# Patient Record
Sex: Female | Born: 2000 | Hispanic: No | Marital: Single | Smoking: Never smoker
Health system: Southern US, Community
[De-identification: ages and names within clinical notes are randomized; demographics above are authoritative.]

## PROBLEM LIST (undated history)

## (undated) DIAGNOSIS — F32A Depression, unspecified: Secondary | ICD-10-CM

## (undated) DIAGNOSIS — F419 Anxiety disorder, unspecified: Secondary | ICD-10-CM

## (undated) HISTORY — PX: NO PAST SURGERIES: SHX2092

## (undated) HISTORY — DX: Depression, unspecified: F32.A

## (undated) HISTORY — DX: Anxiety disorder, unspecified: F41.9

---

## 2019-10-02 DIAGNOSIS — N926 Irregular menstruation, unspecified: Secondary | ICD-10-CM | POA: Insufficient documentation

## 2019-10-02 DIAGNOSIS — L709 Acne, unspecified: Secondary | ICD-10-CM | POA: Insufficient documentation

## 2020-07-09 ENCOUNTER — Other Ambulatory Visit: Payer: Self-pay

## 2020-07-09 ENCOUNTER — Encounter: Payer: Self-pay | Admitting: Medical-Surgical

## 2020-07-09 ENCOUNTER — Ambulatory Visit (INDEPENDENT_AMBULATORY_CARE_PROVIDER_SITE_OTHER): Payer: 59 | Admitting: Medical-Surgical

## 2020-07-09 VITALS — BP 101/70 | HR 62 | Temp 98.3°F | Ht 62.5 in | Wt 173.6 lb

## 2020-07-09 DIAGNOSIS — F329 Major depressive disorder, single episode, unspecified: Secondary | ICD-10-CM | POA: Diagnosis not present

## 2020-07-09 DIAGNOSIS — F324 Major depressive disorder, single episode, in partial remission: Secondary | ICD-10-CM | POA: Insufficient documentation

## 2020-07-09 DIAGNOSIS — Z23 Encounter for immunization: Secondary | ICD-10-CM

## 2020-07-09 DIAGNOSIS — R202 Paresthesia of skin: Secondary | ICD-10-CM

## 2020-07-09 DIAGNOSIS — F32A Depression, unspecified: Secondary | ICD-10-CM

## 2020-07-09 DIAGNOSIS — Z7689 Persons encountering health services in other specified circumstances: Secondary | ICD-10-CM

## 2020-07-09 DIAGNOSIS — F419 Anxiety disorder, unspecified: Secondary | ICD-10-CM | POA: Diagnosis not present

## 2020-07-09 DIAGNOSIS — R200 Anesthesia of skin: Secondary | ICD-10-CM | POA: Diagnosis not present

## 2020-07-09 MED ORDER — FLUOXETINE HCL 20 MG PO TABS: ORAL_TABLET | ORAL | 1 refills | Status: DC

## 2020-07-09 NOTE — Progress Notes (Signed)
New Patient Office Visit  Subjective:  Patient ID: Heidi Calhoun, female    DOB: 2001-05-23  Age: 19 y.o. MRN: [id number]  CC:  Chief Complaint  Patient presents with  . Establish Care    HPI Heidi Calhoun presents to establish care.  Works at [employer] and lives at home with her family and they are husky, Wallace.  Depression/Anxiety-reports initially experiencing depression at 19 years old when she lived in [place].  Her family relocated to [place] and she noted an improvement in her symptoms.  Unfortunately her depression and anxiety returned at about 19 years old.  She admits to self cutting in her teenage years but no longer practices this.  She continues to have difficulty with depression anxiety and believes depression to be the worst between the 2.  She has anxiety when in public places and often has panic attacks involving shortness of breath, racing heart, agitation, and shakiness.  She manages her anxiety attacks with deep breathing, self soothing activities, and or leaves the location.  She has never been on medications for depression or anxiety but she has done therapy in the past.  She felt this was helpful but does not have a current therapy relationship.  Things that make her sad involve thinking about her biological dad, relationships with men, and some childhood trauma from her relationship with her mother.  Admits to having thoughts of self-harm on most days but does not have an active plan or any weapons at home.  Denies HI.  Insomnia-reports that she has trouble sleeping some nights and is often up until 2-3 AM.  At other times, she sleeps too much.  Has quite a bit on her mind and has difficulty stopping her thoughts so that she can fall asleep.  Does not take any medications to help her sleep at night.   Past Medical History:  Diagnosis Date  . Anxiety   . Depression     Past Surgical History:  Procedure Laterality Date  . NO PAST SURGERIES       Family History  Problem Relation Age of Onset  . Hypertension Maternal Uncle   . Diabetes Maternal Uncle   . Diabetes Maternal Grandmother   . Stroke Maternal Grandmother   . Hypertension Maternal Grandfather   . Kidney disease Paternal Grandfather     Social History   Socioeconomic History  . Marital status: Single    Spouse name: Not on file  . Number of children: Not on file  . Years of education: Not on file  . Highest education level: Not on file  Occupational History    Employer: [employer]  Tobacco Use  . Smoking status: Never Smoker  . Smokeless tobacco: Never Used  Vaping Use  . Vaping Use: Former  Substance and Sexual Activity  . Alcohol use: Never  . Drug use: Never  . Sexual activity: Never  Other Topics Concern  . Not on file  Social History Narrative  . Not on file   Social Determinants of Health   Financial Resource Strain:   . Difficulty of Paying Living Expenses: Not on file  Food Insecurity:   . Worried About Programme researcher, broadcasting/film/video in the Last Year: Not on file  . Ran Out of Food in the Last Year: Not on file  Transportation Needs:   . Lack of Transportation (Medical): Not on file  . Lack of Transportation (Non-Medical): Not on file  Physical Activity:   . Days of Exercise  per Week: Not on file  . Minutes of Exercise per Session: Not on file  Stress:   . Feeling of Stress : Not on file  Social Connections:   . Frequency of Communication with Friends and Family: Not on file  . Frequency of Social Gatherings with Friends and Family: Not on file  . Attends Religious Services: Not on file  . Active Member of Clubs or Organizations: Not on file  . Attends Banker Meetings: Not on file  . Marital Status: Not on file  Intimate Partner Violence:   . Fear of Current or Ex-Partner: Not on file  . Emotionally Abused: Not on file  . Physically Abused: Not on file  . Sexually Abused: Not on file    ROS Review of Systems   Constitutional: Positive for fatigue. Negative for chills, fever and unexpected weight change.  Respiratory: Positive for chest tightness (With panic attack) and shortness of breath (With panic attack). Negative for cough and wheezing.   Cardiovascular: Positive for palpitations (With panic attack). Negative for chest pain and leg swelling.  Neurological: Positive for numbness (Right hand on awakening). Negative for dizziness, light-headedness and headaches.  Hematological: Bruises/bleeds easily.  Psychiatric/Behavioral: Positive for dysphoric mood, self-injury (History of cutting but none recently, thoughts of self-harm most days but no plan) and sleep disturbance. Negative for suicidal ideas. The patient is nervous/anxious.     Objective:   Today's Vitals: BP 101/70   Pulse 62   Temp 98.3 F (36.8 C) (Oral)   Ht 5' 2.5" (1.588 m)   Wt 173 lb 9.6 oz (78.7 kg)   SpO2 99%   BMI 31.25 kg/m   Physical Exam Vitals and nursing note reviewed.  Constitutional:      General: She is not in acute distress.    Appearance: Normal appearance.  HENT:     Head: Normocephalic and atraumatic.  Cardiovascular:     Rate and Rhythm: Normal rate and regular rhythm.     Pulses: Normal pulses.     Heart sounds: Normal heart sounds. No murmur heard.  No friction rub. No gallop.   Pulmonary:     Effort: Pulmonary effort is normal. No respiratory distress.     Breath sounds: Normal breath sounds. No wheezing.  Skin:    General: Skin is warm and dry.  Neurological:     Mental Status: She is alert and oriented to person, place, and time.  Psychiatric:        Mood and Affect: Mood normal.        Behavior: Behavior normal.        Thought Content: Thought content normal.        Judgment: Judgment normal.     Assessment & Plan:   1. Encounter to establish care Reviewed available information and discussed health care concerns.  She is due for her first Pap smear as well as an updated physical with  blood work.  2. Anxiety/depression Starting fluoxetine 10 mg daily x8 days then increase to 20 mg daily.  Discussed expectations for efficacy of medication and potential side effects when for starting.  Referring to behavioral health for establishment of a counseling relationship.  Psych emergency information provided with AVS. - Ambulatory referral to Behavioral Health  3. Numbness and tingling in right hand Suspect she has a bit of carpal tunnel from repetitive movements with her job.  Recommend wearing a brace for sleeping at night since awakening is the only time the tingling bothers her.  If her symptoms do worsen, we can evaluate further.  4. Need for Tdap vaccination Tdap given in office by MA. - Tdap vaccine greater than or equal to 7yo IM   Outpatient Encounter Medications as of 07/09/2020  Medication Sig  . doxycycline (VIBRAMYCIN) 100 MG capsule Take 100 mg by mouth 2 (two) times daily.  Marland Kitchen FLUoxetine (PROZAC) 20 MG tablet Take 1/2 tablet (10mg ) daily for 8 days then increase to 1 tablet (20mg ) daily.   No facility-administered encounter medications on file as of 07/09/2020.    Follow-up: Return in about 4 weeks (around 08/06/2020) for mood follow up.   09/08/2020, DNP, APRN, FNP-BC [facility]

## 2020-07-09 NOTE — Patient Instructions (Addendum)
For immediate mental health services:   [facility], [address], [place], [place] [address], [phone]  [facility], [address], [place], [place] [address], [phone] Any emergency room  National Suicide Prevention Lifeline, (816)559-0783    https://www.cdc.gov/vaccines/hcp/vis/vis-statements/tdap.pdf">  Tdap (Tetanus, Diphtheria, Pertussis) Vaccine: What You Need to Know 1. Why get vaccinated? Tdap vaccine can prevent tetanus, diphtheria, and pertussis. Diphtheria and pertussis spread from person to person. Tetanus enters the body through cuts or wounds.  TETANUS (T) causes painful stiffening of the muscles. Tetanus can lead to serious health problems, including being unable to open the mouth, having trouble swallowing and breathing, or death.  DIPHTHERIA (D) can lead to difficulty breathing, heart failure, paralysis, or death.  PERTUSSIS (aP), also known as "whooping cough," can cause uncontrollable, violent coughing which makes it hard to breathe, eat, or drink. Pertussis can be extremely serious in babies and young children, causing pneumonia, convulsions, brain damage, or death. In teens and adults, it can cause weight loss, loss of bladder control, passing out, and rib fractures from severe coughing. 2. Tdap vaccine Tdap is only for children 7 years and older, adolescents, and adults.  Adolescents should receive a single dose of Tdap, preferably at age 50 or 12 years. Pregnant women should get a dose of Tdap during every pregnancy, to protect the newborn from pertussis. Infants are most at risk for severe, life-threatening complications from pertussis. Adults who have never received Tdap should get a dose of Tdap. Also, adults should receive a booster dose every 10 years, or earlier in the case of a severe and dirty wound or burn. Booster doses can be either Tdap or Td (a different vaccine that protects against tetanus and  diphtheria but not pertussis). Tdap may be given at the same time as other vaccines. 3. Talk with your health care provider Tell your vaccine provider if the person getting the vaccine:  Has had an allergic reaction after a previous dose of any vaccine that protects against tetanus, diphtheria, or pertussis, or has any severe, life-threatening allergies.  Has had a coma, decreased level of consciousness, or prolonged seizures within 7 days after a previous dose of any pertussis vaccine (DTP, DTaP, or Tdap).  Has seizures or another nervous system problem.  Has ever had Guillain-Barr Syndrome (also called GBS).  Has had severe pain or swelling after a previous dose of any vaccine that protects against tetanus or diphtheria. In some cases, your health care provider may decide to postpone Tdap vaccination to a future visit.  People with minor illnesses, such as a cold, may be vaccinated. People who are moderately or severely ill should usually wait until they recover before getting Tdap vaccine.  Your health care provider can give you more information. 4. Risks of a vaccine reaction  Pain, redness, or swelling where the shot was given, mild fever, headache, feeling tired, and nausea, vomiting, diarrhea, or stomachache sometimes happen after Tdap vaccine. People sometimes faint after medical procedures, including vaccination. Tell your provider if you feel dizzy or have vision changes or ringing in the ears.  As with any medicine, there is a very remote chance of a vaccine causing a severe allergic reaction, other serious injury, or death. 5. What if there is a serious problem? An allergic reaction could occur after the vaccinated person leaves the clinic. If you see signs of a severe allergic reaction (hives, swelling of the face and throat, difficulty breathing, a fast heartbeat, dizziness, or  weakness), call 9-1-1 and get the person to the nearest hospital. For other signs that concern you,  call your health care provider.  Adverse reactions should be reported to the Vaccine Adverse Event Reporting System (VAERS). Your health care provider will usually file this report, or you can do it yourself. Visit the VAERS website at www.vaers.LAgents.no or call 325-631-2406. VAERS is only for reporting reactions, and VAERS staff do not give medical advice. 6. The National Vaccine Injury Compensation Program The [employer] Vaccine Injury Compensation Program (VICP) is a federal program that was created to compensate people who may have been injured by certain vaccines. Visit the VICP website at SpiritualWord.at or call (704)867-5488 to learn about the program and about filing a claim. There is a time limit to file a claim for compensation. 7. How can I learn more?  Ask your health care provider.  Call your local or state health department.  Contact the Centers for Disease Control and Prevention (CDC): ? Call 602-096-6078 (1-800-CDC-INFO) or ? Visit CDC's website at PicCapture.uy Vaccine Information Statement Tdap (Tetanus, Diphtheria, Pertussis) Vaccine (02/06/2019) This information is not intended to replace advice given to you by your health care provider. Make sure you discuss any questions you have with your health care provider. Document Revised: 02/15/2019 Document Reviewed: 02/18/2019 Elsevier Patient Education  2020 [employer].

## 2020-07-28 ENCOUNTER — Ambulatory Visit (INDEPENDENT_AMBULATORY_CARE_PROVIDER_SITE_OTHER): Payer: 59 | Admitting: Professional

## 2020-07-28 DIAGNOSIS — F331 Major depressive disorder, recurrent, moderate: Secondary | ICD-10-CM | POA: Diagnosis not present

## 2020-07-28 DIAGNOSIS — F4322 Adjustment disorder with anxiety: Secondary | ICD-10-CM

## 2020-08-03 ENCOUNTER — Ambulatory Visit: Payer: 59 | Admitting: Professional

## 2020-08-06 ENCOUNTER — Encounter: Payer: Self-pay | Admitting: Medical-Surgical

## 2020-08-06 ENCOUNTER — Ambulatory Visit (INDEPENDENT_AMBULATORY_CARE_PROVIDER_SITE_OTHER): Payer: 59 | Admitting: Medical-Surgical

## 2020-08-06 ENCOUNTER — Other Ambulatory Visit: Payer: Self-pay

## 2020-08-06 VITALS — BP 102/69 | HR 71 | Temp 98.1°F | Ht 62.5 in | Wt 173.5 lb

## 2020-08-06 DIAGNOSIS — F419 Anxiety disorder, unspecified: Secondary | ICD-10-CM

## 2020-08-06 DIAGNOSIS — F329 Major depressive disorder, single episode, unspecified: Secondary | ICD-10-CM

## 2020-08-06 DIAGNOSIS — F32A Depression, unspecified: Secondary | ICD-10-CM

## 2020-08-06 NOTE — Progress Notes (Signed)
Subjective:    CC: anxiety/depression follow up  HPI: Heidi Calhoun 19 year old female presenting today for anxiety/depression follow up. Started fluoxetine approximately 3 weeks ago with one week at 10mg  followed by an increase to 20mg  daily. Taking the medication as prescribed without side effects. Tolerating well but admits some difficulty taking the medication at the same time every day. Feels that her depression symptoms have improved but her anxiety seems to be worse. Most of her anxiety revolves around work due to other employees not carrying their own workload and leaving it all for her to do at closing. She endorses sleeping better at night. She has been able to see a counselor and will be continuing with therapy as she felt it was beneficial. She is also exercising regularly. She admits she does not get out much and prefers to stay home rather than go out with friends. When she does go out, she prefers to go with one person instead of a group. Feels that her mom is her best friend. At last visit, had passive thoughts of suicide but no self-harm or plan. Today, denies SI/HI.   I reviewed the past medical history, family history, social history, surgical history, and allergies today and no changes were needed.  Please see the problem list section below in epic for further details.  Past Medical History: Past Medical History:  Diagnosis Date  . Anxiety   . Depression    Past Surgical History: Past Surgical History:  Procedure Laterality Date  . NO PAST SURGERIES     Social History: Social History   Socioeconomic History  . Marital status: Single    Spouse name: Not on file  . Number of children: Not on file  . Years of education: Not on file  . Highest education level: Not on file  Occupational History    Employer: [employer]  Tobacco Use  . Smoking status: Never Smoker  . Smokeless tobacco: Never Used  Vaping Use  . Vaping Use: Former  Substance and Sexual Activity  . Alcohol  use: Never  . Drug use: Never  . Sexual activity: Never  Other Topics Concern  . Not on file  Social History Narrative  . Not on file   Social Determinants of Health   Financial Resource Strain:   . Difficulty of Paying Living Expenses: Not on file  Food Insecurity:   . Worried About in the Last Year: Not on file  . Ran Out of Food in the Last Year: Not on file  Transportation Needs:   . Lack of Transportation (Medical): Not on file  . Lack of Transportation (Non-Medical): Not on file  Physical Activity:   . Days of Exercise per Week: Not on file  . Minutes of Exercise per Session: Not on file  Stress:   . Feeling of Stress : Not on file  Social Connections:   . Frequency of Communication with Friends and Family: Not on file  . Frequency of Social Gatherings with Friends and Family: Not on file  . Attends Religious Services: Not on file  . Active Member of Clubs or Organizations: Not on file  . Attends Meetings: Not on file  . Marital Status: Not on file   Family History: Family History  Problem Relation Age of Onset  . Hypertension Maternal Uncle   . Diabetes Maternal Uncle   . Diabetes Maternal Grandmother   . Stroke Maternal Grandmother   . Hypertension Maternal Grandfather   .  Kidney disease Paternal Grandfather    Allergies: Allergies  Allergen Reactions  . Amoxicillin Hives   Medications: See med rec.  Review of Systems: See HPI for pertinent positives and negatives.   Depression screen [facility] 2/9 08/06/2020 07/09/2020  Decreased Interest 2 3  Down, Depressed, Hopeless 2 3  PHQ - 2 Score 4 6  Altered sleeping 0 3  Tired, decreased energy 2 2  Change in appetite 2 2  Feeling bad or failure about yourself  2 3  Trouble concentrating 0 3  Moving slowly or fidgety/restless 0 2  Suicidal thoughts 0 2  PHQ-9 Score 10 23  Difficult doing work/chores Somewhat difficult Somewhat difficult   GAD 7 : Generalized Anxiety  Score 08/06/2020 07/09/2020  Nervous, Anxious, on Edge 2 2  Control/stop worrying 3 1  Worry too much - different things 3 1  Trouble relaxing 3 2  Restless 3 1  Easily annoyed or irritable 2 3  Afraid - awful might happen 2 2  Total GAD 7 Score 18 12  Anxiety Difficulty Somewhat difficult Somewhat difficult   Objective:    General: Well Developed, well nourished, and in no acute distress.  Neuro: Alert and oriented x3.  HEENT: Normocephalic, atraumatic.  Skin: Warm and dry. Cardiac: Regular rate and rhythm, no murmurs rubs or gallops, no lower extremity edema.  Respiratory: Clear to auscultation bilaterally. Not using accessory muscles, speaking in full sentences.  Impression and Recommendations:    1. Anxiety/depression Good improvement in depression symptoms and sleeping much better. Worsening of anxiety possibly related to the medication but suspect that it seems worse because her depression is so much better. Options include switching to a different SSRI, continuing the medication for another few weeks as she has only been on it for 3.5 weeks, or adding in a BID Buspar. Patient would like to continue the medication at the current dose for now. I will reach out to her via MyChart in 3 weeks to see how she is doing. Depending on her response, further plans will be made. Discussed getting out and doing things that she enjoys as well as recommended exercise.   Return in about 3 months (around 11/05/2020) for mood follow up. ___________________________________________ Thayer Ohm, DNP, APRN, FNP-BC Primary Care and Sports Medicine [facility]

## 2020-08-12 ENCOUNTER — Encounter: Payer: Self-pay | Admitting: Medical-Surgical

## 2020-08-12 MED ORDER — BUSPIRONE HCL 7.5 MG PO TABS: 7.5000 mg | ORAL_TABLET | Freq: Two times a day (BID) | ORAL | 1 refills | Status: DC | PRN

## 2020-08-27 ENCOUNTER — Encounter: Payer: Self-pay | Admitting: Medical-Surgical

## 2020-09-11 ENCOUNTER — Other Ambulatory Visit: Payer: Self-pay | Admitting: Medical-Surgical

## 2020-09-15 ENCOUNTER — Encounter: Payer: Self-pay | Admitting: Medical-Surgical

## 2020-10-12 ENCOUNTER — Other Ambulatory Visit: Payer: Self-pay | Admitting: Medical-Surgical

## 2020-10-15 ENCOUNTER — Other Ambulatory Visit: Payer: Self-pay | Admitting: Medical-Surgical

## 2020-10-27 ENCOUNTER — Encounter: Payer: Self-pay | Admitting: Medical-Surgical

## 2020-10-28 MED ORDER — FLUOXETINE HCL 20 MG PO TABS
20.0000 mg | ORAL_TABLET | Freq: Every day | ORAL | 1 refills | Status: DC
Start: 2020-10-28 — End: 2020-11-05

## 2020-11-05 ENCOUNTER — Ambulatory Visit (INDEPENDENT_AMBULATORY_CARE_PROVIDER_SITE_OTHER): Payer: 59 | Admitting: Medical-Surgical

## 2020-11-05 ENCOUNTER — Other Ambulatory Visit: Payer: Self-pay

## 2020-11-05 ENCOUNTER — Encounter: Payer: Self-pay | Admitting: Medical-Surgical

## 2020-11-05 VITALS — BP 92/59 | HR 52 | Temp 98.8°F | Ht 62.5 in | Wt 175.3 lb

## 2020-11-05 DIAGNOSIS — F32A Depression, unspecified: Secondary | ICD-10-CM

## 2020-11-05 DIAGNOSIS — F419 Anxiety disorder, unspecified: Secondary | ICD-10-CM | POA: Diagnosis not present

## 2020-11-05 MED ORDER — FLUOXETINE HCL 20 MG PO TABS: 30.0000 mg | ORAL_TABLET | Freq: Every day | ORAL | 1 refills | Status: DC

## 2020-11-05 MED ORDER — BUSPIRONE HCL 7.5 MG PO TABS: 7.5000 mg | ORAL_TABLET | Freq: Two times a day (BID) | ORAL | 1 refills | Status: DC | PRN

## 2020-11-05 NOTE — Progress Notes (Signed)
Subjective:    CC: mood follow up  HPI: Pleasant 19 year old presenting today for mood follow up.  Taking fluoxetine 20 mg daily, tolerating well without side effects.  Notes that the medication does help some days but there are other days when it does not feel effective.  Using BuSpar 7.5 mg twice daily as needed, feels this medication does help with her anxiety.  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  Feels that she is handling this pretty well although she does get quite frustrated at times with other employees.  She has been working odd hours which is affecting her overall sleep.  Notes that some nights/days that she does not get very much sleep and others she is oversleeping.  She has been reading self-help books and engaging in more activities.  Recently she took her dog for hiking at the falls in [place].  She is now doing counseling through her employer's mental health program.  She just started so she is not certain if this is helpful yet or not.  Denies SI/HI.  I reviewed the past medical history, family history, social history, surgical history, and allergies today and no changes were needed.  Please see the problem list section below in epic for further details.  Past Medical History: Past Medical History:  Diagnosis Date  . Anxiety   . Depression    Past Surgical History: Past Surgical History:  Procedure Laterality Date  . NO PAST SURGERIES     Social History: Social History   Socioeconomic History  . Marital status: Single    Spouse name: Not on file  . Number of children: Not on file  . Years of education: Not on file  . Highest education level: Not on file  Occupational History    Employer: [employer]  Tobacco Use  . Smoking status: Never Smoker  . Smokeless tobacco: Never Used  Vaping Use  . Vaping Use: Former  Substance and Sexual Activity  . Alcohol use: Never  . Drug use: Never  . Sexual activity: Never   Other Topics Concern  . Not on file  Social History Narrative  . Not on file   Social Determinants of Health   Financial Resource Strain: Not on file  Food Insecurity: Not on file  Transportation Needs: Not on file  Physical Activity: Not on file  Stress: Not on file  Social Connections: Not on file   Family History: Family History  Problem Relation Age of Onset  . Hypertension Maternal Uncle   . Diabetes Maternal Uncle   . Diabetes Maternal Grandmother   . Stroke Maternal Grandmother   . Hypertension Maternal Grandfather   . Kidney disease Paternal Grandfather    Allergies: Allergies  Allergen Reactions  . Amoxicillin Hives   Medications: See med rec.  Review of Systems: See HPI for pertinent positives and negatives.   Depression screen [facility] 2/9 11/05/2020 08/06/2020 07/09/2020  Decreased Interest 2 2 3   Down, Depressed, Hopeless 2 2 3   PHQ - 2 Score 4 4 6   Altered sleeping 1 0 3  Tired, decreased energy 1 2 2   Change in appetite 1 2 2   Feeling bad or failure about yourself  1 2 3   Trouble concentrating 0 0 3  Moving slowly or fidgety/restless 0 0 2  Suicidal thoughts 0 0 2  PHQ-9 Score 8 10 23   Difficult doing work/chores Somewhat difficult Somewhat difficult Somewhat difficult   GAD 7 :  Generalized Anxiety Score 11/05/2020 08/06/2020 07/09/2020  Nervous, Anxious, on Edge 1 2 2   Control/stop worrying 1 3 1   Worry too much - different things 1 3 1   Trouble relaxing 3 3 2   Restless 1 3 1   Easily annoyed or irritable 1 2 3   Afraid - awful might happen 1 2 2   Total GAD 7 Score 9 18 12   Anxiety Difficulty Somewhat difficult Somewhat difficult Somewhat difficult   Objective:    General: Well Developed, well nourished, and in no acute distress.  Neuro: Alert and oriented x3.  HEENT: Normocephalic, atraumatic.  Skin: Warm and dry. Cardiac: Regular rate and rhythm, no murmurs rubs or gallops, no lower extremity edema.  Respiratory: Clear to auscultation  bilaterally. Not using accessory muscles, speaking in full sentences.   Impression and Recommendations:    1. Anxiety/depression Increasing fluoxetine to 30 mg daily.  Continue BuSpar 7.5 mg twice daily as needed.  Continue counseling.  Advised patient to notify me immediately should her symptoms get worse or should she have SI/HI.  Patient verbalized understanding and is agreeable to the plan.  Return in about 4 weeks (around 12/03/2020) for mood follow up. ___________________________________________ , DNP, APRN, FNP-BC Primary Care and Sports Medicine [facility]

## 2020-12-03 ENCOUNTER — Ambulatory Visit: Payer: 59 | Admitting: Medical-Surgical

## 2020-12-10 ENCOUNTER — Encounter: Payer: Self-pay | Admitting: Medical-Surgical

## 2021-01-06 ENCOUNTER — Other Ambulatory Visit: Payer: Self-pay | Admitting: Medical-Surgical

## 2021-01-27 ENCOUNTER — Encounter: Payer: Self-pay | Admitting: Medical-Surgical

## 2021-01-27 ENCOUNTER — Ambulatory Visit: Payer: 59 | Admitting: Medical-Surgical

## 2021-01-27 ENCOUNTER — Other Ambulatory Visit: Payer: Self-pay

## 2021-01-27 VITALS — BP 89/57 | HR 61 | Temp 98.5°F | Ht 62.5 in | Wt 172.6 lb

## 2021-01-27 DIAGNOSIS — F32A Depression, unspecified: Secondary | ICD-10-CM

## 2021-01-27 DIAGNOSIS — F419 Anxiety disorder, unspecified: Secondary | ICD-10-CM

## 2021-01-27 DIAGNOSIS — L659 Nonscarring hair loss, unspecified: Secondary | ICD-10-CM

## 2021-01-27 MED ORDER — FLUOXETINE HCL 20 MG PO TABS: 30.0000 mg | ORAL_TABLET | Freq: Every day | ORAL | 1 refills | Status: DC

## 2021-01-27 NOTE — Progress Notes (Signed)
Subjective:    CC: depression follow up/hair loss  HPI: Pleasant 20 year old female presenting for the following:  Depression/anxiety- taking Fluoxetine 30mg  daily, tolerating well without side effects. Feels that this medication controls her symptoms well. Only uses BuSpar about 1-2 times per month when she gets significantly anxious. Has applied to college for forensic science and wants to work as a . Will be starting classes in the Summer semester. Still has her family support in place and her dog Blue who is her companion.   Hair loss- has noticed that she is losing quite a bit of hair every day, more than she usually did. Has some variation in her diet depending on her mood with some days overeating and others barely eating at all. Had COVID near the beginning of the year.   I reviewed the past medical history, family history, social history, surgical history, and allergies today and no changes were needed.  Please see the problem list section below in epic for further details.  Past Medical History: Past Medical History:  Diagnosis Date  . Anxiety   . Depression    Past Surgical History: Past Surgical History:  Procedure Laterality Date  . NO PAST SURGERIES     Social History: Social History   Socioeconomic History  . Marital status: Single    Spouse name: Not on file  . Number of children: Not on file  . Years of education: Not on file  . Highest education level: Not on file  Occupational History    Employer: [employer]  Tobacco Use  . Smoking status: Never Smoker  . Smokeless tobacco: Never Used  Vaping Use  . Vaping Use: Former  Substance and Sexual Activity  . Alcohol use: Never  . Drug use: Never  . Sexual activity: Never  Other Topics Concern  . Not on file  Social History Narrative  . Not on file   Social Determinants of Health   Financial Resource Strain: Not on file  Food Insecurity: Not on file  Transportation Needs: Not on file   Physical Activity: Not on file  Stress: Not on file  Social Connections: Not on file   Family History: Family History  Problem Relation Age of Onset  . Hypertension Maternal Uncle   . Diabetes Maternal Uncle   . Diabetes Maternal Grandmother   . Stroke Maternal Grandmother   . Hypertension Maternal Grandfather   . Kidney disease Paternal Grandfather    Allergies: Allergies  Allergen Reactions  . Amoxicillin Hives   Medications: See med rec.  Review of Systems: See HPI for pertinent positives and negatives.   Depression screen [facility] 2/9 01/27/2021 11/05/2020 08/06/2020 07/09/2020  Decreased Interest 0 2 2 3   Down, Depressed, Hopeless 1 2 2 3   PHQ - 2 Score 1 4 4 6   Altered sleeping 2 1 0 3  Tired, decreased energy 2 1 2 2   Change in appetite 3 1 2 2   Feeling bad or failure about yourself  1 1 2 3   Trouble concentrating 1 0 0 3  Moving slowly or fidgety/restless 0 0 0 2  Suicidal thoughts 0 0 0 2  PHQ-9 Score 10 8 10 23   Difficult doing work/chores Somewhat difficult Somewhat difficult Somewhat difficult Somewhat difficult   GAD 7 : Generalized Anxiety Score 01/27/2021 11/05/2020 08/06/2020 07/09/2020  Nervous, Anxious, on Edge 1 1 2 2   Control/stop worrying 0 1 3 1   Worry too much - different things 2 1 3 1   Trouble relaxing  2 3 3 2   Restless 1 1 3 1   Easily annoyed or irritable 1 1 2 3   Afraid - awful might happen 1 1 2 2   Total GAD 7 Score 8 9 18 12   Anxiety Difficulty Somewhat difficult Somewhat difficult Somewhat difficult Somewhat difficult     Objective:    General: Well Developed, well nourished, and in no acute distress.  Neuro: Alert and oriented x3.  HEENT: Normocephalic, atraumatic.  Skin: Warm and dry. Cardiac: Regular rate and rhythm, no murmurs rubs or gallops, no lower extremity edema.  Respiratory: Clear to auscultation bilaterally. Not using accessory muscles, speaking in full sentences.   Impression and Recommendations:    1. Anxiety 2.  Depression, unspecified depression type Stable. Continue Fluoxetine 30mg  daily and BuSpar 7.5mg  prn. Refills sent to pharmacy.   3. Hair loss Checking labs. 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 Consider starting a daily MVI if not already doing so. Work to optimize dietary intake/nutrition.  - CBC with Differential/Platelet - COMPLETE METABOLIC PANEL WITH GFR - TSH - Fe+TIBC+Fer  Return in about 6 weeks (around 03/10/2021) for mood follow up. ___________________________________________ , DNP, APRN, FNP-BC Primary Care and Sports Medicine [facility] [place]

## 2021-01-27 NOTE — Patient Instructions (Signed)
MyPlate from USDA  MyPlate is an outline of a general healthy diet based on the 2010 Dietary Guidelines for Americans, from the U.S. Department of Agriculture Architect). It sets guidelines for how much food you should eat from each food group based on your age, sex, and level of physical activity. What are tips for following MyPlate? To follow MyPlate recommendations:  Eat a wide variety of fruits and vegetables, grains, and protein foods.  Serve smaller portions and eat less food throughout the day.  Limit portion sizes to avoid overeating.  Enjoy your food.  Get at least 150 minutes of exercise every week. This is about 30 minutes each day, 5 or more days per week. It can be difficult to have every meal look like MyPlate. Think about MyPlate as eating guidelines for an entire day, rather than each individual meal. Fruits and vegetables  Make half of your plate fruits and vegetables.  Eat many different colors of fruits and vegetables each day.  For a 2,000 calorie daily food plan, eat: ? 2 cups of vegetables every day. ? 2 cups of fruit every day.  1 cup is equal to: ? 1 cup raw or cooked vegetables. ? 1 cup raw fruit. ? 1 medium-sized orange, apple, or banana. ? 1 cup 100% fruit or vegetable juice. ? 2 cups raw leafy greens, such as lettuce, spinach, or kale. ?  cup dried fruit. Grains  One fourth of your plate should be grains.  Make at least half of the grains you eat each day whole grains.  For a 2,000 calorie daily food plan, eat 6 oz of grains every day.  1 oz is equal to: ? 1 slice bread. ? 1 cup cereal. ?  cup cooked rice, cereal, or pasta. Protein  One fourth of your plate should be protein.  Eat a wide variety of protein foods, including meat, poultry, fish, eggs, beans, nuts, and tofu.  For a 2,000 calorie daily food plan, eat 5 oz of protein every day.  1 oz is equal to: ? 1 oz meat, poultry, or fish. ?  cup cooked beans. ? 1 egg. ?  oz nuts  or seeds. ? 1 Tbsp peanut butter. Dairy  Drink fat-free or low-fat (1%) milk.  Eat or drink dairy as a side to meals.  For a 2,000 calorie daily food plan, eat or drink 3 cups of dairy every day.  1 cup is equal to: ? 1 cup milk, yogurt, cottage cheese, or soy milk (soy beverage). ? 2 oz processed cheese. ? 1 oz natural cheese. Fats, oils, salt, and sugars  Only small amounts of oils are recommended.  Avoid foods that are high in calories and low in nutritional value (empty calories), like foods high in fat or added sugars.  Choose foods that are low in salt (sodium). Choose foods that have less than 140 milligrams (mg) of sodium per serving.  Drink water instead of sugary drinks. Drink enough water each day to keep your urine pale yellow. Where to find support  Work with your health care provider or a nutrition specialist (dietitian) to develop a customized eating plan that is right for you.  Download an app (mobile application) to help you track your daily food intake. Where to find more information  Go to https://www.[facility].org/ for more information. Summary  MyPlate is a general guideline for healthy eating from the USDA. It is based on the 2010 Dietary Guidelines for Americans.  In general, fruits and vegetables should  take up  of your plate, grains should take up  of your plate, and protein should take up  of your plate. This information is not intended to replace advice given to you by your health care provider. Make sure you discuss any questions you have with your health care provider. Document Revised: 03/28/2019 Document Reviewed: 01/23/2017 Elsevier Patient Education  2021 Elsevier Inc.  

## 2021-01-28 LAB — CBC WITH DIFFERENTIAL/PLATELET
Absolute Monocytes: 403 cells/uL (ref 200–950)
Basophils Absolute: 31 cells/uL (ref 0–200)
Basophils Relative: 0.6 %
Eosinophils Absolute: 20 cells/uL (ref 15–500)
Eosinophils Relative: 0.4 %
HCT: 38 % (ref 35.0–45.0)
Hemoglobin: 12.6 g/dL (ref 11.7–15.5)
Lymphs Abs: 1826 cells/uL (ref 850–3900)
MCH: 29.9 pg (ref 27.0–33.0)
MCHC: 33.2 g/dL (ref 32.0–36.0)
MCV: 90 fL (ref 80.0–100.0)
MPV: 10.6 fL (ref 7.5–12.5)
Monocytes Relative: 7.9 %
Neutro Abs: 2820 cells/uL (ref 1500–7800)
Neutrophils Relative %: 55.3 %
Platelets: 229 10*3/uL (ref 140–400)
RBC: 4.22 10*6/uL (ref 3.80–5.10)
RDW: 12.7 % (ref 11.0–15.0)
Total Lymphocyte: 35.8 %
WBC: 5.1 10*3/uL (ref 3.8–10.8)

## 2021-01-28 LAB — COMPLETE METABOLIC PANEL WITH GFR
AG Ratio: 1.7 (calc) (ref 1.0–2.5)
ALT: 12 U/L (ref 6–29)
AST: 15 U/L (ref 10–30)
Albumin: 4.8 g/dL (ref 3.6–5.1)
Alkaline phosphatase (APISO): 50 U/L (ref 31–125)
BUN: 11 mg/dL (ref 7–25)
CO2: 28 mmol/L (ref 20–32)
Calcium: 9.8 mg/dL (ref 8.6–10.2)
Chloride: 102 mmol/L (ref 98–110)
Creat: 0.61 mg/dL (ref 0.50–1.10)
GFR, Est African American: 149 mL/min/{1.73_m2} (ref >=60)
GFR, Est Non African American: 129 mL/min/{1.73_m2} (ref >=60)
Globulin: 2.9 g/dL (calc) (ref 1.9–3.7)
Glucose, Bld: 81 mg/dL (ref 65–99)
Potassium: 4.2 mmol/L (ref 3.5–5.3)
Sodium: 136 mmol/L (ref 135–146)
Total Bilirubin: 0.5 mg/dL (ref 0.2–1.2)
Total Protein: 7.7 g/dL (ref 6.1–8.1)

## 2021-01-28 LAB — IRON,TIBC AND FERRITIN PANEL
%SAT: 27 % (calc) (ref 16–45)
Ferritin: 15 ng/mL — ABNORMAL LOW (ref 16–154)
Iron: 94 ug/dL (ref 40–190)
TIBC: 350 mcg/dL (calc) (ref 250–450)

## 2021-01-28 LAB — TSH: TSH: 1.05 mIU/L

## 2021-02-16 ENCOUNTER — Encounter: Payer: Self-pay | Admitting: Medical-Surgical

## 2021-02-22 ENCOUNTER — Telehealth (INDEPENDENT_AMBULATORY_CARE_PROVIDER_SITE_OTHER): Payer: 59 | Admitting: Medical-Surgical

## 2021-02-22 ENCOUNTER — Encounter: Payer: Self-pay | Admitting: Medical-Surgical

## 2021-02-22 DIAGNOSIS — R634 Abnormal weight loss: Secondary | ICD-10-CM | POA: Diagnosis not present

## 2021-02-22 DIAGNOSIS — R110 Nausea: Secondary | ICD-10-CM

## 2021-02-22 MED ORDER — FAMOTIDINE 20 MG PO TABS: 20.0000 mg | ORAL_TABLET | Freq: Two times a day (BID) | ORAL | 0 refills | Status: DC | PRN

## 2021-02-22 MED ORDER — ONDANSETRON 8 MG PO TBDP: 8.0000 mg | ORAL_TABLET | Freq: Three times a day (TID) | ORAL | 3 refills | Status: DC | PRN

## 2021-02-22 NOTE — Progress Notes (Signed)
Virtual Visit via Video Note  I connected with Heidi Calhoun on 02/22/21 at  9:50 AM EDT by a video enabled telemedicine application and verified that I am speaking with the correct person using two identifiers.   I discussed the limitations of evaluation and management by telemedicine and the availability of in person appointments. The patient expressed understanding and agreed to proceed.  Patient location: home Provider locations: office  Subjective:    CC: Weight loss, nausea  HPI: Pleasant 20 year old female presenting via MyChart video visit with complaints of frequent nausea after eating as well as rapid weight loss.  Her symptoms started immediately after her family pet died.  She has lost approximately 8 pounds.  She has had no vomiting but gets nauseous after every meal no matter what she eats.  Has been focusing more on bland foods.  She has been drinking more water lately as she is very thirsty.  Her symptoms have somewhat improved over the last week or so and now she is eating approximately 3 meals per day.  Denies fever, chills, constipation/diarrhea, or other viral symptoms.   Continues to take fluoxetine as prescribed daily.  She only uses BuSpar about once a week on Sundays due to increased work stress on those days.  Feels that it does help with her overall anxiety/depression symptoms.   Past medical history, Surgical history, Family history not pertinant except as noted below, Social history, Allergies, and medications have been entered into the medical record, reviewed, and corrections made.   Review of Systems: See HPI for pertinent positives and negatives.   Objective:    General: Speaking clearly in complete sentences without any shortness of breath.  Alert and oriented x3.  Normal judgment. No apparent acute distress.  Impression and Recommendations:    1. Nausea 2. Weight loss Consider grief reaction versus reflux versus viral gastritis.  Start famotidine  20 mg twice daily.  Zofran 8 mg ODT every 8 hours as needed for nausea.  Discussed starting with frequent small meals and bland foods and increasing as tolerated.  Continue fluoxetine and buspirone as prescribed.  Okay to increase the sparing use of buspirone during this stressful time.  I discussed the assessment and treatment plan with the patient. The patient was provided an opportunity to ask questions and all were answered. The patient agreed with the plan and demonstrated an understanding of the instructions.   The patient was advised to call back or seek an in-person evaluation if the symptoms worsen or if the condition fails to improve as anticipated.  20 minutes of non-face-to-face time was provided during this encounter.  Return if symptoms worsen or fail to improve.  Thayer Ohm, DNP, APRN, FNP-BC [facility] and Sports Medicine

## 2021-06-14 ENCOUNTER — Other Ambulatory Visit (HOSPITAL_COMMUNITY)
Admission: RE | Admit: 2021-06-14 | Discharge: 2021-06-14 | Disposition: A | Discharge status: Home / Self Care | Payer: 59 | Source: Ambulatory Visit | Attending: Medical-Surgical | Admitting: Medical-Surgical

## 2021-06-14 ENCOUNTER — Encounter: Payer: Self-pay | Admitting: Medical-Surgical

## 2021-06-14 ENCOUNTER — Ambulatory Visit (INDEPENDENT_AMBULATORY_CARE_PROVIDER_SITE_OTHER): Payer: 59 | Admitting: Medical-Surgical

## 2021-06-14 ENCOUNTER — Other Ambulatory Visit: Payer: Self-pay

## 2021-06-14 VITALS — BP 108/72 | HR 63 | Resp 20 | Ht 62.5 in | Wt 185.0 lb

## 2021-06-14 DIAGNOSIS — F32A Depression, unspecified: Secondary | ICD-10-CM | POA: Diagnosis not present

## 2021-06-14 DIAGNOSIS — Z124 Encounter for screening for malignant neoplasm of cervix: Secondary | ICD-10-CM | POA: Diagnosis present

## 2021-06-14 DIAGNOSIS — N926 Irregular menstruation, unspecified: Secondary | ICD-10-CM | POA: Diagnosis not present

## 2021-06-14 DIAGNOSIS — F419 Anxiety disorder, unspecified: Secondary | ICD-10-CM

## 2021-06-14 DIAGNOSIS — Z30013 Encounter for initial prescription of injectable contraceptive: Secondary | ICD-10-CM

## 2021-06-14 DIAGNOSIS — Z113 Encounter for screening for infections with a predominantly sexual mode of transmission: Secondary | ICD-10-CM | POA: Insufficient documentation

## 2021-06-14 LAB — OB RESULTS CONSOLE GC/CHLAMYDIA: Chlamydia: NEGATIVE

## 2021-06-14 MED ORDER — FLUOXETINE HCL 20 MG PO TABS: 40.0000 mg | ORAL_TABLET | Freq: Every day | ORAL | 1 refills | Status: DC

## 2021-06-14 MED ORDER — MEDROXYPROGESTERONE ACETATE 150 MG/ML IM SUSP
150.0000 mg | Freq: Once | INTRAMUSCULAR | Status: AC
  Administered 2021-06-14: 150 mg via INTRAMUSCULAR

## 2021-06-14 NOTE — Patient Instructions (Signed)

## 2021-06-14 NOTE — Progress Notes (Signed)
HPI with pertinent ROS:   CC: Birth control, menstrual issues  HPI: Pleasant 20 year old female presenting today for discussion on menstrual concerns and birth control.  She does have a history of irregular menses and notes that this is gotten worse as she has gained weight recently.  She has issues where she will spot for a couple of days and then stop.  A few days later, she will have significant heavy bleeding for several days.  She will stop for a week and then restart a week later.  She also has heavy cramps and mood changes with her periods.  She does have a bit of excessive hair just below her bellybutton but no issues with hirsutism on the face.  Has never had a Pap smear but would like to have that done today.  Has recently become sexually active and although she uses condoms most of the time, she has had 1 episode without a condom and would like to have STI testing.  She is interested in birth control for contraception as well as menstrual cycle management.  Took pills in the past and did okay with them but notes she had a lot of spotting while she was on them.  Has not been on any other birth control.  Mood-taking fluoxetine 30 mg daily, tolerating well without side effects.  She has had a lot of changes in her life recently and is feeling somewhat stressed out and down and depressed.  She does have a new job at a new location and reports that is a positive.  They did lose their beloved family pet but have recently gotten 2 new puppies who are now 44 months old.  That is helping with her grief some but she still has moments of sadness.  She has BuSpar 7.5 mg that she uses twice daily as needed.  She only takes this every now and then but does note that it helps overall.  Denies SI/HI.  I reviewed the past medical history, family history, social history, surgical history, and allergies today and no changes were needed.  Please see the problem list section below in epic for further  details.   Physical exam:   General: Well Developed, well nourished, and in no acute distress.  Neuro: Alert and oriented x3.  HEENT: Normocephalic, atraumatic.  Skin: Warm and dry. Cardiac: Regular rate and rhythm, no murmurs rubs or gallops, no lower extremity edema.  Respiratory: Clear to auscultation bilaterally. Not using accessory muscles, speaking in full sentences. Pelvic exam: normal external genitalia, vulva, vagina, cervix, uterus and adnexa, PAP: Pap smear done today, WET MOUNT done - results: Pending lab report, exam chaperoned by Gonzella Lex, MA.  Impression and Recommendations:    1. Encounter for initial prescription of injectable contraceptive 2. Irregular menses Discussed available options for management of menses and contraception.  After review of risks versus benefits, she would like to try Depo-Provera.  Urine pregnancy test negative today.  Strongly encouraged use of condoms with all sexual activity including oral sex to prevent transmission of STIs. - medroxyPROGESTERone (DEPO-PROVERA) injection 150 mg  3. Depression, unspecified depression type 4. Anxiety Increasing fluoxetine to 40 mg daily.  Encouraged her to use BuSpar 7.5 mg twice daily as needed more frequently if this is helpful.  5. Cervical cancer screening Pap smear completed today.  STI testing for gonorrhea, chlamydia, and trichomonas added to sample. - Cytology - PAP  6. Routine screening for STI (sexually transmitted infection) Sending wet prep for yeast and  BV.  STI testing added onto Pap smear sample.  Checking for HIV, hepatitis B, hepatitis C, and syphilis. - WET PREP FOR TRICH, YEAST, CLUE - Cytology - PAP - HIV Antibody (routine testing w rflx) - Hepatitis C Antibody - Hepatitis B surface antigen - RPR  Return in about 4 weeks (around 07/12/2021) for mood/birth control follow up. ___________________________________________ Thayer Ohm, DNP, APRN, FNP-BC Primary Care and  Sports Medicine [facility] [place]

## 2021-06-15 LAB — WET PREP FOR TRICH, YEAST, CLUE
MICRO NUMBER:: 12216935
Specimen Quality: ADEQUATE

## 2021-06-15 LAB — HEPATITIS C ANTIBODY
Hepatitis C Ab: NONREACTIVE
SIGNAL TO CUT-OFF: 0.01 (ref <1.00)

## 2021-06-15 LAB — CYTOLOGY - PAP
Chlamydia: NEGATIVE
Comment: NEGATIVE
Comment: NEGATIVE
Comment: NORMAL
Diagnosis: NEGATIVE
Neisseria Gonorrhea: NEGATIVE
Trichomonas: NEGATIVE

## 2021-06-15 LAB — RPR: RPR Ser Ql: NONREACTIVE

## 2021-06-15 LAB — HIV ANTIBODY (ROUTINE TESTING W REFLEX): HIV 1&2 Ab, 4th Generation: NONREACTIVE

## 2021-06-15 LAB — HEPATITIS B SURFACE ANTIGEN: Hepatitis B Surface Ag: NONREACTIVE

## 2021-07-30 ENCOUNTER — Other Ambulatory Visit: Payer: Self-pay | Admitting: Medical-Surgical

## 2021-07-30 ENCOUNTER — Ambulatory Visit: Payer: 59 | Admitting: Medical-Surgical

## 2021-08-09 ENCOUNTER — Encounter: Payer: Self-pay | Admitting: Medical-Surgical

## 2021-08-09 ENCOUNTER — Ambulatory Visit (INDEPENDENT_AMBULATORY_CARE_PROVIDER_SITE_OTHER): Payer: 59 | Admitting: Medical-Surgical

## 2021-08-09 VITALS — BP 117/80 | HR 71 | Resp 20 | Ht 62.5 in | Wt 191.1 lb

## 2021-08-09 DIAGNOSIS — Z3202 Encounter for pregnancy test, result negative: Secondary | ICD-10-CM | POA: Diagnosis not present

## 2021-08-09 DIAGNOSIS — F419 Anxiety disorder, unspecified: Secondary | ICD-10-CM | POA: Diagnosis not present

## 2021-08-09 DIAGNOSIS — Z23 Encounter for immunization: Secondary | ICD-10-CM | POA: Diagnosis not present

## 2021-08-09 DIAGNOSIS — F32A Depression, unspecified: Secondary | ICD-10-CM | POA: Diagnosis not present

## 2021-08-09 LAB — POCT URINE PREGNANCY: Preg Test, Ur: NEGATIVE

## 2021-08-09 MED ORDER — BUPROPION HCL ER (XL) 150 MG PO TB24: 150.0000 mg | ORAL_TABLET | ORAL | 0 refills | Status: DC

## 2021-08-09 MED ORDER — FLUOXETINE HCL 60 MG PO TABS: 60.0000 mg | ORAL_TABLET | Freq: Every day | ORAL | 1 refills | Status: DC

## 2021-08-09 NOTE — Progress Notes (Signed)
  HPI with pertinent ROS:   CC: mood follow up  HPI: Pleasant 20 year old female presenting today for mood follow up. Taking Fluoxetine 40mg  daily as prescribed, tolerating well without side effects. Taking Buspar 7.5mg  daily. Feels like the medications are not helping like they did several months ago. Notes her depressive symptoms are the biggest issue now and she sleeps a lot with little to no interest in doing things. Rarely interacts with people outside of work/family. Doing counseling every couple of weeks but isn't sure if it's helpful or not. Admits to using food as comfort and has had some associated weight gain. Not exercising.   I reviewed the past medical history, family history, social history, surgical history, and allergies today and no changes were needed.  Please see the problem list section below in epic for further details.   Physical exam:   General: Well Developed, well nourished, and in no acute distress.  Neuro: Alert and oriented x3.  HEENT: Normocephalic, atraumatic.  Skin: Warm and dry Cardiac: Regular rate and rhythm, no murmurs rubs or gallops, no lower extremity edema.  Respiratory: Clear to auscultation bilaterally. Not using accessory muscles, speaking in full sentences.  Impression and Recommendations:    1. Flu vaccine need Flu vaccine given in office.  - Flu Vaccine QUAD 60mo+IM (Fluarix, Fluzone & Alfiuria Quad PF)  2. Pregnancy examination or test, negative result On Depo but patient requested. Negative result.  - POCT urine pregnancy  3. Anxiety 4. Depression, unspecified depression type Increasing fluoxetine to 60mg  daily. Continue Buspar 7.5mg  BID prn. Adding Wellbutrin 150mg  daily. Continue counseling.   Return in about 4 weeks (around 09/06/2021) for mood follow up. ___________________________________________ , DNP, APRN, FNP-BC Primary Care and Sports Medicine [facility] [place]

## 2021-08-25 ENCOUNTER — Encounter: Payer: Self-pay | Admitting: Medical-Surgical

## 2021-08-25 DIAGNOSIS — R440 Auditory hallucinations: Secondary | ICD-10-CM

## 2021-08-25 DIAGNOSIS — R441 Visual hallucinations: Secondary | ICD-10-CM

## 2021-08-25 DIAGNOSIS — F32A Depression, unspecified: Secondary | ICD-10-CM

## 2021-08-25 DIAGNOSIS — F419 Anxiety disorder, unspecified: Secondary | ICD-10-CM

## 2021-09-06 ENCOUNTER — Ambulatory Visit: Payer: 59

## 2021-09-06 ENCOUNTER — Encounter: Payer: Self-pay | Admitting: Medical-Surgical

## 2021-09-06 ENCOUNTER — Ambulatory Visit: Payer: 59 | Admitting: Medical-Surgical

## 2021-09-06 VITALS — BP 111/75 | HR 67 | Resp 20 | Ht 62.2 in | Wt 191.0 lb

## 2021-09-06 DIAGNOSIS — Z3042 Encounter for surveillance of injectable contraceptive: Secondary | ICD-10-CM | POA: Diagnosis not present

## 2021-09-06 DIAGNOSIS — F419 Anxiety disorder, unspecified: Secondary | ICD-10-CM

## 2021-09-06 DIAGNOSIS — F32A Depression, unspecified: Secondary | ICD-10-CM | POA: Diagnosis not present

## 2021-09-06 MED ORDER — BUSPIRONE HCL 7.5 MG PO TABS: 7.5000 mg | ORAL_TABLET | Freq: Two times a day (BID) | ORAL | 1 refills | Status: DC | PRN

## 2021-09-06 MED ORDER — MEDROXYPROGESTERONE ACETATE 150 MG/ML IM SUSY: PREFILLED_SYRINGE | Freq: Once | INTRAMUSCULAR | Status: AC

## 2021-09-06 MED ORDER — FLUOXETINE HCL 60 MG PO TABS: 60.0000 mg | ORAL_TABLET | Freq: Every day | ORAL | 1 refills | Status: DC

## 2021-09-06 NOTE — Progress Notes (Signed)
  HPI with pertinent ROS:   CC: Mood follow-up, next Depo shot  HPI: Pleasant 20 year old female presenting today for follow up on mood. She is taking Fluoxetine 60mg  daily, tolerating well without side effects. Also taking BuSpar 7.5 mg twice daily prn anxiety. Reports she uses this pretty regularly. Most recently was experiencing visual hallucinations that were very disturbing to her. She was referred to psychiatry for further management and will have her first appointment with them in about a month. Since her message, she has not had any more hallucinations but she attributes this to her pagan friend doing a protection spell for her. Notes that her anxiety symptoms have gotten better but she is more depressed. Did not start Wellbutrin that was prescribed at our last visit. Reports not sleeping well. Denies SI/HI.   I reviewed the past medical history, family history, social history, surgical history, and allergies today and no changes were needed.  Please see the problem list section below in epic for further details.   Physical exam:   General: Well Developed, well nourished, and in no acute distress.  Neuro: Alert and oriented x3. HEENT: Normocephalic, atraumatic. Skin: Warm and dry. Cardiac: Regular rate and rhythm, no murmurs rubs or gallops, no lower extremity edema.  Respiratory: Clear to auscultation bilaterally. Not using accessory muscles, speaking in full sentences.  Impression and Recommendations:    1. Encounter for surveillance of injectable contraceptive Depo-Provera given in office today.   2. Anxiety 3. Depression, unspecified depression type Discussed symptoms with patient. Given that she is going to see psychiatry in about a month, she would like to continue her current regimen of Fluoxetine with prn BuSpar until she sees them. Refills sent to pharmacy.   Return in about 6 months (around 03/06/2022) for general follow  up. ___________________________________________ 03/08/2022, DNP, APRN, FNP-BC Primary Care and Sports Medicine [facility] [place]

## 2021-10-07 ENCOUNTER — Encounter (HOSPITAL_COMMUNITY): Payer: Self-pay | Admitting: Psychiatry

## 2021-10-07 ENCOUNTER — Ambulatory Visit (INDEPENDENT_AMBULATORY_CARE_PROVIDER_SITE_OTHER): Payer: 59 | Admitting: Psychiatry

## 2021-10-07 ENCOUNTER — Other Ambulatory Visit: Payer: Self-pay

## 2021-10-07 VITALS — BP 116/76 | HR 57 | Ht 62.5 in | Wt 190.0 lb

## 2021-10-07 DIAGNOSIS — F419 Anxiety disorder, unspecified: Secondary | ICD-10-CM

## 2021-10-07 DIAGNOSIS — F331 Major depressive disorder, recurrent, moderate: Secondary | ICD-10-CM | POA: Diagnosis not present

## 2021-10-07 MED ORDER — BUPROPION HCL ER (SR) 100 MG PO TB12: 100.0000 mg | ORAL_TABLET | Freq: Every day | ORAL | 1 refills | Status: DC

## 2021-10-07 NOTE — Progress Notes (Signed)
Psychiatric Initial Adult Assessment   Patient Identification: Heidi Calhoun MRN:  [id number] Date of Evaluation:  10/07/2021 Referral Source: primary care Chief Complaint:  establish care ,depression Visit Diagnosis:    ICD-10-CM   1. MDD (major depressive disorder), recurrent episode, moderate (HCC)  F33.1     2. Anxiety disorder, unspecified type  F41.9       History of Present Illness: Patient is a 20 years old currently single female living with her mom and stepdad.  She is currently working Scientist, research (medical) referred by primary care physician to establish care for possible depression and anxiety  Patient states has suffered from depression since young age and has a difficult childhood growing up with parents who would fight with his other.  Currently she is living with her mom and stepdad at age 30 she has self cut behavior and was started on therapy.  Lately this year she has been struggling with depression specially after COVID and also one of her dog died middle of this year leading to her exacerbation of depression including decreased energy withdrawn decreased interest in things excessive sleeping appetite changes and feeling of despair but not hopelessness or suicidal thoughts  She does endorse worries but she believes her depression is more concerning rather than anxiety her anxiety has improved with the help of Prozac she is not worrying excessively she does have a sleep irregularity and decreased sleep at times or excessive she wakes up tired she does suffer from anemia  She feels Prozac has helped depression to certain extent but still feels above symptoms.  There is no associated psychotic symptoms as of now middle of October there was 1-2 times she would hear her voice calling her name or she was seeing figures standing next to her on the right side it happened for a couple of days but not anymore she is not endorsing hallucinations as of now she does  endorse she was feeling down depressed and it may be related with Stress  but she is not experiencing that as of now There is no clear manic symptoms Denies drug use Denies past psychiatric admission or treatment  She does see a therapist with [facility] biweekly to cope with her depression and coping skills  Aggravating factors difficult childhood.  Work stress, dog died Modifying factors; her mom, 2 husky dogs younger sister       Past Psychiatric History: depression, anxiety  Previous Psychotropic Medications: No   Substance Abuse History in the last 12 months:  No.  Consequences of Substance Abuse: NA  Past Medical History:  Past Medical History:  Diagnosis Date   Anxiety    Depression     Past Surgical History:  Procedure Laterality Date   NO PAST SURGERIES      Family Psychiatric History: momL: depression; dad : alcohol use  Family History:  Family History  Problem Relation Age of Onset   Hypertension Maternal Uncle    Diabetes Maternal Uncle    Diabetes Maternal Grandmother    Stroke Maternal Grandmother    Hypertension Maternal Grandfather    Kidney disease Paternal Grandfather     Social History:   Social History   Socioeconomic History   Marital status: Single    Spouse name: Not on file   Number of children: Not on file   Years of education: Not on file   Highest education level: Not on file  Occupational History    Employer: [employer]  Tobacco Use  Smoking status: Never   Smokeless tobacco: Never  Vaping Use   Vaping Use: Former  Substance and Sexual Activity   Alcohol use: Never   Drug use: Never   Sexual activity: Yes    Birth control/protection: Injection  Other Topics Concern   Not on file  Social History Narrative   Not on file   Social Determinants of Health   Financial Resource Strain: Not on file  Food Insecurity: Not on file  Transportation Needs: Not on file  Physical Activity: Not on file  Stress: Not on file   Social Connections: Not on file    Additional Social History: grew up with parents in [place] they would argue and fight. Difficult growing up, struggled with depression as teenage and did self cutting around age 73  Allergies:   Allergies  Allergen Reactions   Amoxicillin Hives    Metabolic Disorder Labs: No results found for: HGBA1C, MPG No results found for: PROLACTIN No results found for: CHOL, TRIG, HDL, CHOLHDL, VLDL, LDLCALC Lab Results  Component Value Date   TSH 1.05 01/27/2021    Therapeutic Level Labs: No results found for: LITHIUM No results found for: CBMZ No results found for: VALPROATE  Current Medications: Current Outpatient Medications  Medication Sig Dispense Refill   buPROPion ER (WELLBUTRIN SR) 100 MG 12 hr tablet Take 1 tablet (100 mg total) by mouth daily. 30 tablet 1   FLUoxetine HCl 60 MG TABS Take 60 mg by mouth daily. 90 tablet 1   No current facility-administered medications for this visit.    Psychiatric Specialty Exam: Review of Systems  Cardiovascular:  Negative for chest pain.  Psychiatric/Behavioral:  Positive for dysphoric mood. Negative for agitation, self-injury and suicidal ideas.    Blood pressure 116/76, pulse (!) 57, height 5' 2.5" (1.588 m), weight 190 lb (86.2 kg), SpO2 97 %.Body mass index is 34.2 kg/m.  General Appearance: Casual  Eye Contact:  Fair  Speech:  Normal Rate  Volume:  Decreased  Mood:  Dysphoric  Affect:  Congruent  Thought Process:  Goal Directed  Orientation:  Full (Time, Place, and Person)  Thought Content:  Rumination  Suicidal Thoughts:  No  Homicidal Thoughts:  No  Memory:  Immediate;   Fair  Judgement:  Fair  Insight:  Fair  Psychomotor Activity:  Decreased  Concentration:  Concentration: Fair  Recall:  Good  Fund of Knowledge:Good  Language: Fair  Akathisia:  NA  Handed:    AIMS (if indicated):  not done  Assets:  Communication Skills Desire for Improvement Housing Physical Health  ADL's:   Intact  Cognition: WNL  Sleep:  Poor   Screenings: GAD-7    Flowsheet Row Office Visit from 09/06/2021 in [facility] Office Visit from 08/09/2021 in [facility] Office Visit from 01/27/2021 in [facility] At [facility] Office Visit from 11/05/2020 in [facility] At [facility] Office Visit from 08/06/2020 in [facility]  Total GAD-7 Score 11 13 8 9 18       PHQ2-9    Flowsheet Row Office Visit from 10/07/2021 in [facility] [place] Office Visit from 09/06/2021 in [facility] Office Visit from 08/09/2021 in [facility] Office Visit from 01/27/2021 in [facility] At [facility] Office Visit from 11/05/2020 in [facility] At [facility]  PHQ-2 Total Score 5 3 6 1 4   PHQ-9 Total Score 20 16 19 10 8       Flowsheet Row Office Visit from 10/07/2021 in [facility] AT [place]  C-SSRS RISK CATEGORY Error: Q3, 4, or 5 should not be populated when Q2 is No       Assessment and Plan: as follows Major depressive disorder recurrent moderate to severe; she is not endorsing psychotic symptoms as of now she is already on Prozac but she feels less energy decreased interest in things.  We will add Wellbutrin 100 mg cautioned about if there is any concern about hallucinations to notify she is also need to follow-up closely with primary care physician and treatment for her anemia continue therapy Discussed sleep hygiene Generalized anxiety disorder anxiety disorder unspecified improved with Prozac continue Prozac  Follow-up in 3 to 4 weeks or earlier if needed Face to face time spent 45 min including review and charting 14/11/2020, MD 12/1/20229:46 AM

## 2021-10-22 ENCOUNTER — Encounter: Payer: Self-pay | Admitting: Emergency Medicine

## 2021-10-22 ENCOUNTER — Other Ambulatory Visit: Payer: Self-pay

## 2021-10-22 ENCOUNTER — Emergency Department
Admission: EM | Admit: 2021-10-22 | Discharge: 2021-10-22 | Disposition: A | Discharge status: Home / Self Care | Payer: 59 | Source: Home / Self Care

## 2021-10-22 DIAGNOSIS — J039 Acute tonsillitis, unspecified: Secondary | ICD-10-CM | POA: Diagnosis not present

## 2021-10-22 DIAGNOSIS — H6692 Otitis media, unspecified, left ear: Secondary | ICD-10-CM

## 2021-10-22 MED ORDER — CEFDINIR 300 MG PO CAPS: 300.0000 mg | ORAL_CAPSULE | Freq: Two times a day (BID) | ORAL | 0 refills | Status: AC

## 2021-10-22 MED ORDER — PREDNISONE 20 MG PO TABS: ORAL_TABLET | ORAL | 0 refills | Status: DC

## 2021-10-22 NOTE — Discharge Instructions (Addendum)
Advised patient to take medication as directed with food to completion.  Advised patient to take Prednisone with first dose of Cefdinir for the next 5 of 10 days.  Encouraged patient to increase daily water intake while taking this medication.

## 2021-10-22 NOTE — ED Provider Notes (Signed)
Heidi Calhoun [facility]    CSN: [id number] Arrival date & time: 10/22/21  1716      History   Chief Complaint Chief Complaint  Patient presents with   Nasal Congestion    HPI Heidi Calhoun is a 20 y.o. female.   HPI 20 year old female presents with nasal congestion, sore throat, and eye itching that started on Monday.  Reports taking OTC cold medications.  Past Medical History:  Diagnosis Date   Anxiety    Depression     Patient Active Problem List   Diagnosis Date Noted   Anxiety 07/09/2020   Depression 07/09/2020   Numbness and tingling in right hand 07/09/2020   Irregular menses 10/02/2019   Acne 10/02/2019    Past Surgical History:  Procedure Laterality Date   NO PAST SURGERIES      OB History   No obstetric history on file.      Home Medications    Prior to Admission medications   Medication Sig Start Date End Date Taking? Authorizing Provider  cefdinir (OMNICEF) 300 MG capsule Take 1 capsule (300 mg total) by mouth 2 (two) times daily for 10 days. 10/22/21 11/01/21 Yes Trevor Iha, FNP  predniSONE (DELTASONE) 20 MG tablet Take 3 tabs PO daily x 5 days. 10/22/21  Yes Trevor Iha, FNP  buPROPion ER [facility] SR) 100 MG 12 hr tablet Take 1 tablet (100 mg total) by mouth daily. 10/07/21 10/07/22  Thresa Ross, MD  FLUoxetine HCl 60 MG TABS Take 60 mg by mouth daily. 09/06/21   Christen Butter, NP    Family History Family History  Problem Relation Age of Onset   Hypertension Maternal Uncle    Diabetes Maternal Uncle    Diabetes Maternal Grandmother    Stroke Maternal Grandmother    Hypertension Maternal Grandfather    Kidney disease Paternal Grandfather     Social History Social History   Tobacco Use   Smoking status: Never   Smokeless tobacco: Never  Vaping Use   Vaping Use: Former  Substance Use Topics   Alcohol use: Never   Drug use: Never     Allergies   Amoxicillin   Review of Systems Review of Systems  HENT:   Positive for congestion and sore throat.   Eyes:  Positive for itching.  All other systems reviewed and are negative.   Physical Exam Triage Vital Signs ED Triage Vitals  Enc Vitals Group     BP 10/22/21 1730 116/78     Pulse Rate 10/22/21 1730 81     Resp 10/22/21 1730 18     Temp 10/22/21 1730 98.2 F (36.8 C)     Temp Source 10/22/21 1730 Oral     SpO2 10/22/21 1730 99 %     Weight 10/22/21 1732 188 lb (85.3 kg)     Height --      Head Circumference --      Peak Flow --      Pain Score 10/22/21 1732 4     Pain Loc --      Pain Edu? --      Excl. in GC? --    No data found.  Updated Vital Signs BP 116/78 (BP Location: Right Arm)    Pulse 81    Temp 98.2 F (36.8 C) (Oral)    Resp 18    Wt 188 lb (85.3 kg)    LMP 10/21/2021 (Exact Date)    SpO2 99%    BMI 33.84 kg/m  Physical Exam Vitals and nursing note reviewed.  Constitutional:      General: She is not in acute distress.    Appearance: Normal appearance. She is obese. She is not ill-appearing.  HENT:     Head: Normocephalic and atraumatic.     Right Ear: Hearing, tympanic membrane, ear canal and external ear normal.     Left Ear: Hearing, ear canal and external ear normal. Tympanic membrane is erythematous and bulging.     Mouth/Throat:     Mouth: Mucous membranes are moist.     Pharynx: Oropharynx is clear. Uvula midline. Uvula swelling present.     Tonsils: Tonsillar exudate present. 4+ on the right. 4+ on the left.  Eyes:     Extraocular Movements: Extraocular movements intact.     Conjunctiva/sclera: Conjunctivae normal.     Pupils: Pupils are equal, round, and reactive to light.  Cardiovascular:     Rate and Rhythm: Normal rate and regular rhythm.     Pulses: Normal pulses.     Heart sounds: Normal heart sounds.  Pulmonary:     Effort: Pulmonary effort is normal.     Breath sounds: Normal breath sounds.  Musculoskeletal:     Cervical back: Normal range of motion and neck supple.  Skin:     General: Skin is warm and dry.  Neurological:     General: No focal deficit present.     Mental Status: She is alert and oriented to person, place, and time. Mental status is at baseline.     UC Treatments / Results  Labs (all labs ordered are listed, but only abnormal results are displayed) Labs Reviewed - No data to display  EKG   Radiology No results found.  Procedures Procedures (including critical care time)  Medications Ordered in UC Medications - No data to display  Initial Impression / Assessment and Plan / UC Course  I have reviewed the triage vital signs and the nursing notes.  Pertinent labs & imaging results that were available during my care of the patient were reviewed by me and considered in my medical decision making (see chart for details).      MDM: 1.  Acute tonsillitis, unspecified etiology-Rx'd Cefdinir and prednisone; 2.  Acute left otitis media-Rx'd Cefdinir. Advised patient to take medication as directed with food to completion.  Advised patient to take Prednisone with first dose of Cefdinir for the next 5 of 10 days.  Encouraged patient to increase daily water intake while taking this medication.Advised patient to take medication as directed with food to completion.  Advised patient to take Prednisone with first dose of Cefdinir for the next 5 of 10 days.  Encouraged patient to increase daily water intake while taking this medication.  Work note provided prior to discharge.  Patient discharged home, hemodynamically stable  Final Clinical Impressions(s) / UC Diagnoses   Final diagnoses:  Acute tonsillitis, unspecified etiology  Acute left otitis media     Discharge Instructions      Advised patient to take medication as directed with food to completion.  Advised patient to take Prednisone with first dose of Cefdinir for the next 5 of 10 days.  Encouraged patient to increase daily water intake while taking this medication.     ED Prescriptions      Medication Sig Dispense Auth. Provider   cefdinir (OMNICEF) 300 MG capsule Take 1 capsule (300 mg total) by mouth 2 (two) times daily for 10 days. 20 capsule Trevor Iha, FNP  predniSONE (DELTASONE) 20 MG tablet Take 3 tabs PO daily x 5 days. 15 tablet Trevor Iha, FNP      PDMP not reviewed this encounter.   Trevor Iha, FNP 10/22/21 [id number]

## 2021-10-22 NOTE — ED Triage Notes (Signed)
Pt c/o nasal congestion, sore throat and eye itching that started Monday. Has been taking OTC cold meds.

## 2021-11-04 ENCOUNTER — Encounter (HOSPITAL_COMMUNITY): Payer: Self-pay | Admitting: Psychiatry

## 2021-11-04 ENCOUNTER — Ambulatory Visit (INDEPENDENT_AMBULATORY_CARE_PROVIDER_SITE_OTHER): Payer: 59 | Admitting: Psychiatry

## 2021-11-04 VITALS — BP 124/78 | Temp 97.6°F | Ht 62.5 in | Wt 193.0 lb

## 2021-11-04 DIAGNOSIS — F331 Major depressive disorder, recurrent, moderate: Secondary | ICD-10-CM | POA: Diagnosis not present

## 2021-11-04 DIAGNOSIS — F419 Anxiety disorder, unspecified: Secondary | ICD-10-CM

## 2021-11-04 MED ORDER — BUPROPION HCL ER (SR) 150 MG PO TB12: 150.0000 mg | ORAL_TABLET | Freq: Every day | ORAL | 1 refills | Status: DC

## 2021-11-04 NOTE — Progress Notes (Signed)
[facility] Follow up visit  Patient Identification: Heidi Calhoun MRN:  [id number] Date of Evaluation:  11/04/2021 Referral Source: primary care Chief Complaint:  establish care ,depression Visit Diagnosis:    ICD-10-CM   1. MDD (major depressive disorder), recurrent episode, moderate (HCC)  F33.1     2. Anxiety disorder, unspecified type  F41.9       History of Present Illness: Patient is a 20 years old currently single female living with her mom and stepdad.  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  Work stress, dog died Modifying factors; her mom, 2 husky dogs , younger sister  Severity some better, still subdued     Past Psychiatric History: depression, anxiety  Previous Psychotropic Medications: No     Past Medical History:  Past Medical History:  Diagnosis Date   Anxiety    Depression     Past Surgical History:  Procedure Laterality Date   NO PAST SURGERIES      Family Psychiatric History: momL: depression; dad : alcohol use  Family History:  Family History  Problem Relation Age of Onset   Hypertension Maternal Uncle    Diabetes Maternal Uncle    Diabetes Maternal Grandmother    Stroke Maternal Grandmother    Hypertension Maternal Grandfather    Kidney disease Paternal Grandfather     Social History:    Social History   Socioeconomic History   Marital status: Single    Spouse name: Not on file   Number of children: Not on file   Years of education: Not on file   Highest education level: Not on file  Occupational History    Employer: [employer]  Tobacco Use   Smoking status: Never   Smokeless tobacco: Never  Vaping Use   Vaping Use: Former  Substance and Sexual Activity   Alcohol use: Never   Drug use: Never   Sexual activity: Yes    Birth control/protection: Injection  Other Topics Concern   Not on file  Social History Narrative   Not on file   Social Determinants of Health   Financial Resource Strain: Not on file  Food Insecurity: Not on file  Transportation Needs: Not on file  Physical Activity: Not on file  Stress: Not on file  Social Connections: Not on file    Allergies:   Allergies  Allergen Reactions   Amoxicillin Hives    Metabolic Disorder Labs: No results found for: HGBA1C, MPG No results found for: PROLACTIN No results found for: CHOL, TRIG, HDL, CHOLHDL, VLDL, LDLCALC Lab Results  Component Value Date   TSH 1.05 01/27/2021    Therapeutic Level Labs: No results found for: LITHIUM No results found for: CBMZ No results found for: VALPROATE  Current Medications: Current Outpatient Medications  Medication Sig Dispense Refill   buPROPion (WELLBUTRIN SR) 150 MG 12 hr tablet Take 1 tablet (150  mg total) by mouth daily. 30 tablet 1   FLUoxetine HCl 60 MG TABS Take 60 mg by mouth daily. 90 tablet 1   predniSONE (DELTASONE) 20 MG tablet Take 3 tabs PO daily x 5 days. 15 tablet 0   No current facility-administered medications for this visit.    Psychiatric Specialty Exam: Review of Systems  Cardiovascular:  Negative for chest pain.  Neurological:  Negative for tremors.  Psychiatric/Behavioral:  Positive for dysphoric mood. Negative for agitation, self-injury and suicidal ideas.    Blood pressure 124/78, temperature 97.6 F (36.4 C), height 5'  2.5" (1.588 m), weight 193 lb (87.5 kg), last menstrual period 10/21/2021.Body mass index is 34.74 kg/m.  General Appearance: Casual  Eye Contact:  Fair  Speech:  Normal Rate  Volume:  Decreased  Mood:  subdued  Affect:  Congruent  Thought Process:  Goal Directed  Orientation:  Full (Time, Place, and Person)  Thought Content:  Rumination  Suicidal Thoughts:  No  Homicidal Thoughts:  No  Memory:  Immediate;   Fair  Judgement:  Fair  Insight:  Fair  Psychomotor Activity:  Decreased  Concentration:  Concentration: Fair  Recall:  Good  Fund of Knowledge:Good  Language: Fair  Akathisia:  NA  Handed:    AIMS (if indicated):  not done  Assets:  Communication Skills Desire for Improvement Housing Physical Health  ADL's:  Intact  Cognition: WNL  Sleep:  Poor   Screenings: GAD-7    Flowsheet Row Office Visit from 09/06/2021 in [facility] Office Visit from 08/09/2021 in [facility] At [facility] Office Visit from 01/27/2021 in [facility] At [facility] Office Visit from 11/05/2020 in [facility] At [facility] Office Visit from 08/06/2020 in [facility] At [facility]  Total GAD-7 Score 11 13 8 9 18       PHQ2-9    Flowsheet Row Office Visit from 10/07/2021 in [facility] [place] Office Visit from 09/06/2021 in [facility] Office Visit from 08/09/2021 in [facility] At [facility] Office Visit from 01/27/2021 in [facility] Care At [facility] Office Visit from 11/05/2020 in [facility] At [facility]  PHQ-2 Total Score 5 3 6 1 4   PHQ-9 Total Score 20 16 19 10 8       Flowsheet Row Office Visit from 11/04/2021 in [facility] AT [place] ED from 10/22/2021 in [facility] Visit from  10/07/2021 in [facility] AT [place]  C-SSRS RISK CATEGORY Error: Q3, 4, or 5 should not be populated when Q2 is No No Risk Error: Q3, 4, or 5 should not be populated when Q2 is No       Assessment and Plan: as follows  Prior documentation reviewed  Major depressive disorder recurrent moderate to severe; some better but still subdued, increase wellbutrin to 150mg    Generalized anxiety disorder fluctuates, but feels prozac helps, will continue Continue therapy with counsellor  Add acitivities to day structure when not working  Fu 6w  Face to face time spent 15 - 20 minutes including chart review, documentation and med adjustment [facility], MD 12/29/20228:44 AM

## 2021-11-29 ENCOUNTER — Ambulatory Visit: Payer: 59

## 2021-12-28 ENCOUNTER — Ambulatory Visit: Payer: Self-pay

## 2021-12-30 ENCOUNTER — Ambulatory Visit (HOSPITAL_COMMUNITY): Payer: Self-pay | Admitting: Psychiatry

## 2022-01-07 ENCOUNTER — Telehealth (HOSPITAL_COMMUNITY): Payer: Self-pay | Admitting: Psychiatry

## 2022-01-19 ENCOUNTER — Ambulatory Visit: Payer: Self-pay

## 2022-01-21 ENCOUNTER — Other Ambulatory Visit: Payer: Self-pay

## 2022-01-21 ENCOUNTER — Ambulatory Visit: Payer: 59

## 2022-02-03 ENCOUNTER — Ambulatory Visit: Payer: 59 | Admitting: Medical-Surgical

## 2022-02-10 ENCOUNTER — Ambulatory Visit: Payer: 59 | Admitting: Medical-Surgical

## 2022-02-10 ENCOUNTER — Encounter: Payer: Self-pay | Admitting: Medical-Surgical

## 2022-02-10 VITALS — BP 110/75 | HR 78 | Resp 20 | Ht 62.5 in | Wt 200.9 lb

## 2022-02-10 DIAGNOSIS — Z30016 Encounter for initial prescription of transdermal patch hormonal contraceptive device: Secondary | ICD-10-CM

## 2022-02-10 DIAGNOSIS — F32A Depression, unspecified: Secondary | ICD-10-CM

## 2022-02-10 DIAGNOSIS — F419 Anxiety disorder, unspecified: Secondary | ICD-10-CM | POA: Diagnosis not present

## 2022-02-10 LAB — POCT URINE PREGNANCY: Preg Test, Ur: NEGATIVE

## 2022-02-10 MED ORDER — NORELGESTROMIN-ETH ESTRADIOL 150-35 MCG/24HR TD PTWK: MEDICATED_PATCH | TRANSDERMAL | 1 refills | Status: DC

## 2022-02-10 NOTE — Progress Notes (Signed)
?  HPI with pertinent ROS:  ? ?CC: birth control  ? ?HPI: ?Pleasant 21 year old female presenting today to discuss birth control options. She is not currently on birth control. She is sexually active with one female partner. Reports using condoms with all sexual encounters. LMP started 2 weeks ago. No concern for STIs and no current vaginal symptoms. Previously tried birth control pills but did not remember to take them. Also tried Depo-Provera but did not like that one either. Has a friend who uses the patch and wants to try that. Denies smoking. No history of migraines.  ? ?I reviewed the past medical history, family history, social history, surgical history, and allergies today and no changes were needed.  Please see the problem list section below in epic for further details. ? ? ?Physical exam:  ? ?General: Well Developed, well nourished, and in no acute distress.  ?Neuro: Alert and oriented x3.  ?HEENT: Normocephalic, atraumatic.  ?Skin: Warm and dry. ?Cardiac: Regular rate and rhythm, no murmurs rubs or gallops, no lower extremity edema.  ?Respiratory: Clear to auscultation bilaterally. Not using accessory muscles, speaking in full sentences. ? ?Impression and Recommendations:   ? ?1. Encounter for initial prescription of transdermal patch hormonal contraceptive device ?POCT UPT negative. Discussed various options for birth control. She would like to try the patch so sending in [place]. Reviewed administration instructions and the importance of continued condom use to prevent STIs. Reviewed emergency symptoms to see urgent care/ED evaluation for. Patient verbalized understanding.  ?- POCT urine pregnancy ?- norelgestromin-ethinyl estradiol [facility]) 150-35 MCG/24HR transdermal patch; Apply 1 patch each week for 3 weeks (21 total days); followed by 1 week that is patch-free. Each patch should be applied on the same day each week ("patch change day") and only 1 patch should be worn at a time. No more than 7 days  should pass during the patch-free interval.  Dispense: 3 patch; Refill: 1 ? ?2. Anxiety ?3. Depression, unspecified depression type ?Discussed current treatment. 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 For now, continue Fluoxetine and Wellbutrin as prescribed.  ? ?Return in about 6 weeks (around 03/24/2022) for birth control follow up. ?___________________________________________ ?Thayer Ohm, DNP, APRN, FNP-BC ?Primary Care and Sports Medicine ?[facility] ?

## 2022-02-24 ENCOUNTER — Encounter: Payer: Self-pay | Admitting: Emergency Medicine

## 2022-02-24 ENCOUNTER — Emergency Department: Admit: 2022-02-24 | Discharge status: Absent without leave | Payer: Self-pay

## 2022-02-24 ENCOUNTER — Emergency Department
Admission: EM | Admit: 2022-02-24 | Discharge: 2022-02-24 | Disposition: A | Discharge status: Home / Self Care | Payer: 59 | Source: Home / Self Care | Attending: Family Medicine | Admitting: Family Medicine

## 2022-02-24 DIAGNOSIS — J029 Acute pharyngitis, unspecified: Secondary | ICD-10-CM

## 2022-02-24 LAB — POCT RAPID STREP A (OFFICE): Rapid Strep A Screen: NEGATIVE

## 2022-02-24 LAB — POC SARS CORONAVIRUS 2 AG -  ED: SARS Coronavirus 2 Ag: NEGATIVE

## 2022-02-24 MED ORDER — FLUTICASONE PROPIONATE 50 MCG/ACT NA SUSP: 2.0000 | Freq: Every day | NASAL | 0 refills | Status: DC

## 2022-02-24 NOTE — ED Triage Notes (Signed)
Pt c/o right ear pain since Monday. She is also having dizziness and sore throat today. She last had tylenol at 12 today  ?

## 2022-02-24 NOTE — ED Provider Notes (Signed)
?[facility] ? ? ? ?CSN: [id number] ?Arrival date & time: 02/24/22  1432 ? ? ?  ? ?History   ?Chief Complaint ?Chief Complaint  ?Patient presents with  ? Otalgia  ? ? ?HPI ?Heidi Calhoun is a 21 y.o. female.  ? ?HPI ? ?Patient states that she has ear pain on the right.  When she leans forward it causes pressure and pain in her face.  She states she has a runny nose and is blowing "yellow snot".  She also has a sore throat.  Sore throat is getting progressively worse.  She states she has been feeling lightheaded.  Achy.  She feels cold but has not taken her temperature.  No known exposure to illness. ? ?Past Medical History:  ?Diagnosis Date  ? Anxiety   ? Depression   ? ? ?Patient Active Problem List  ? Diagnosis Date Noted  ? Anxiety 07/09/2020  ? Depression 07/09/2020  ? Numbness and tingling in right hand 07/09/2020  ? Irregular menses 10/02/2019  ? Acne 10/02/2019  ? ? ?Past Surgical History:  ?Procedure Laterality Date  ? NO PAST SURGERIES    ? ? ?OB History   ?No obstetric history on file. ?  ? ? ? ?Home Medications   ? ?Prior to Admission medications   ?Medication Sig Start Date End Date Taking? Authorizing Provider  ?fluticasone (FLONASE) 50 MCG/ACT nasal spray Place 2 sprays into both nostrils daily. 02/24/22  Yes Eustace Moore, MD  ?buPROPion [facility] SR) 150 MG 12 hr tablet Take 1 tablet (150 mg total) by mouth daily. 11/04/21 11/04/22  Thresa Ross, MD  ?FLUoxetine HCl 60 MG TABS Take 60 mg by mouth daily. 09/06/21   Christen Butter, NP  ?norelgestromin-ethinyl estradiol [facility]) 150-35 MCG/24HR transdermal patch Apply 1 patch each week for 3 weeks (21 total days); followed by 1 week that is patch-free. Each patch should be applied on the same day each week ("patch change day") and only 1 patch should be worn at a time. No more than 7 days should pass during the patch-free interval. 02/10/22   Christen Butter, NP  ? ? ?Family History ?Family History  ?Problem Relation Age of Onset  ?  Hypertension Maternal Uncle   ? Diabetes Maternal Uncle   ? Diabetes Maternal Grandmother   ? Stroke Maternal Grandmother   ? Hypertension Maternal Grandfather   ? Kidney disease Paternal Grandfather   ? ? ?Social History ?Social History  ? ?Tobacco Use  ? Smoking status: Never  ? Smokeless tobacco: Never  ?Vaping Use  ? Vaping Use: Former  ?Substance Use Topics  ? Alcohol use: Never  ? Drug use: Never  ? ? ? ?Allergies   ?Amoxicillin ? ? ?Review of Systems ?Review of Systems ? ?See HPI ?Physical Exam ?Triage Vital Signs ?ED Triage Vitals  ?Enc Vitals Group  ?   BP 02/24/22 1440 119/82  ?   Pulse Rate 02/24/22 1440 92  ?   Resp 02/24/22 1440 16  ?   Temp 02/24/22 1440 98.5 ?F (36.9 ?C)  ?   Temp Source 02/24/22 1440 Oral  ?   SpO2 02/24/22 1440 99 %  ?   Weight --   ?   Height --   ?   Head Circumference --   ?   Peak Flow --   ?   Pain Score 02/24/22 1442 5  ?   Pain Loc --   ?   Pain Edu? --   ?  Excl. in GC? --   ? ?No data found. ? ?Updated Vital Signs ?BP 119/82 (BP Location: Right Arm)   Pulse 92   Temp 98.5 ?F (36.9 ?C) (Oral)   Resp 16   LMP 01/19/2022 (Approximate)   SpO2 99%    ? ?Physical Exam ?Constitutional:   ?   General: She is not in acute distress. ?   Appearance: She is well-developed. She is ill-appearing.  ?HENT:  ?   Head: Normocephalic and atraumatic.  ?   Right Ear: Tympanic membrane and ear canal normal.  ?   Left Ear: Tympanic membrane and ear canal normal.  ?   Nose: Congestion and rhinorrhea present.  ?   Mouth/Throat:  ?   Pharynx: Posterior oropharyngeal erythema present.  ?Eyes:  ?   Conjunctiva/sclera: Conjunctivae normal.  ?   Pupils: Pupils are equal, round, and reactive to light.  ?Cardiovascular:  ?   Rate and Rhythm: Normal rate and regular rhythm.  ?   Heart sounds: Normal heart sounds.  ?Pulmonary:  ?   Effort: Pulmonary effort is normal. No respiratory distress.  ?   Breath sounds: Normal breath sounds.  ?Abdominal:  ?   General: There is no distension.  ?   Palpations:  Abdomen is soft.  ?Musculoskeletal:     ?   General: Normal range of motion.  ?   Cervical back: Normal range of motion.  ?Lymphadenopathy:  ?   Cervical: No cervical adenopathy.  ?Skin: ?   General: Skin is warm and dry.  ?Neurological:  ?   Mental Status: She is alert.  ?Psychiatric:     ?   Mood and Affect: Mood normal.     ?   Behavior: Behavior normal.  ? ? ? ?UC Treatments / Results  ?Labs ?(all labs ordered are listed, but only abnormal results are displayed) ?Labs Reviewed  ?CULTURE, GROUP A STREP  ?POCT RAPID STREP A (OFFICE)  ?POC SARS CORONAVIRUS 2 AG -  ED  ?COVID test negative ?Rapid strep test is negative.  Throat culture is sent ?EKG ? ? ?Radiology ?No results found. ? ?Procedures ?Procedures (including critical care time) ? ?Medications Ordered in UC ?Medications - No data to display ? ?Initial Impression / Assessment and Plan / UC Course  ?I have reviewed the triage vital signs and the nursing notes. ? ?Pertinent labs & imaging results that were available during my care of the patient were reviewed by me and considered in my medical decision making (see chart for details). ? ?  ? ?Final Clinical Impressions(s) / UC Diagnoses  ? ?Final diagnoses:  ?Acute pharyngitis, unspecified etiology  ? ? ? ?Discharge Instructions   ? ?  ?Make sure that you drink plenty of fluids ?Take Tylenol or ibuprofen for throat pain, salt water gargles may also help with throat pain ?May take over the counter cough and cold medicine ?Flonase 2 x a day for 2-3 days then once a day until the congestion is better ? ? ? ? ? ? ?ED Prescriptions   ? ? Medication Sig Dispense Auth. Provider  ? fluticasone (FLONASE) 50 MCG/ACT nasal spray Place 2 sprays into both nostrils daily. 16 g Eustace Moore, MD  ? ?  ? ?PDMP not reviewed this encounter. ?  ?Eustace Moore, MD ?02/24/22 1518 ? ?

## 2022-02-24 NOTE — Discharge Instructions (Addendum)
Make sure that you drink plenty of fluids ?Take Tylenol or ibuprofen for throat pain, salt water gargles may also help with throat pain ?May take over the counter cough and cold medicine ?Flonase 2 x a day for 2-3 days then once a day until the congestion is better ? ? ?

## 2022-02-27 ENCOUNTER — Emergency Department (HOSPITAL_BASED_OUTPATIENT_CLINIC_OR_DEPARTMENT_OTHER)
Admission: EM | Admit: 2022-02-27 | Discharge: 2022-02-27 | Disposition: A | Discharge status: Home / Self Care | Payer: 59 | Attending: Emergency Medicine | Admitting: Emergency Medicine

## 2022-02-27 ENCOUNTER — Encounter (HOSPITAL_BASED_OUTPATIENT_CLINIC_OR_DEPARTMENT_OTHER): Payer: Self-pay | Admitting: Emergency Medicine

## 2022-02-27 ENCOUNTER — Other Ambulatory Visit: Payer: Self-pay

## 2022-02-27 ENCOUNTER — Emergency Department (HOSPITAL_BASED_OUTPATIENT_CLINIC_OR_DEPARTMENT_OTHER): Payer: 59

## 2022-02-27 DIAGNOSIS — J039 Acute tonsillitis, unspecified: Secondary | ICD-10-CM | POA: Insufficient documentation

## 2022-02-27 DIAGNOSIS — R000 Tachycardia, unspecified: Secondary | ICD-10-CM | POA: Diagnosis not present

## 2022-02-27 DIAGNOSIS — R55 Syncope and collapse: Secondary | ICD-10-CM | POA: Diagnosis present

## 2022-02-27 LAB — URINALYSIS, MICROSCOPIC (REFLEX)

## 2022-02-27 LAB — URINALYSIS, ROUTINE W REFLEX MICROSCOPIC
Bilirubin Urine: NEGATIVE
Glucose, UA: NEGATIVE mg/dL
Ketones, ur: NEGATIVE mg/dL
Nitrite: NEGATIVE
Protein, ur: NEGATIVE mg/dL
Specific Gravity, Urine: 1.015 (ref 1.005–1.030)
pH: 7 (ref 5.0–8.0)

## 2022-02-27 LAB — CBC
HCT: 39.9 % (ref 36.0–46.0)
Hemoglobin: 13.4 g/dL (ref 12.0–15.0)
MCH: 29.3 pg (ref 26.0–34.0)
MCHC: 33.6 g/dL (ref 30.0–36.0)
MCV: 87.1 fL (ref 80.0–100.0)
Platelets: 273 10*3/uL (ref 150–400)
RBC: 4.58 MIL/uL (ref 3.87–5.11)
RDW: 12.6 % (ref 11.5–15.5)
WBC: 5.9 10*3/uL (ref 4.0–10.5)
nRBC: 0 % (ref 0.0–0.2)

## 2022-02-27 LAB — CULTURE, GROUP A STREP: Strep A Culture: NEGATIVE

## 2022-02-27 LAB — BASIC METABOLIC PANEL
Anion gap: 9 (ref 5–15)
BUN: 10 mg/dL (ref 6–20)
CO2: 26 mmol/L (ref 22–32)
Calcium: 9 mg/dL (ref 8.9–10.3)
Chloride: 105 mmol/L (ref 98–111)
Creatinine, Ser: 0.69 mg/dL (ref 0.44–1.00)
GFR, Estimated: >60 mL/min (ref >60)
Glucose, Bld: 88 mg/dL (ref 70–99)
Potassium: 3.5 mmol/L (ref 3.5–5.1)
Sodium: 140 mmol/L (ref 135–145)

## 2022-02-27 LAB — CBG MONITORING, ED: Glucose-Capillary: 94 mg/dL (ref 70–99)

## 2022-02-27 LAB — D-DIMER, QUANTITATIVE: D-Dimer, Quant: 0.6 ug/mL-FEU — ABNORMAL HIGH (ref 0.00–0.50)

## 2022-02-27 LAB — PREGNANCY, URINE: Preg Test, Ur: NEGATIVE

## 2022-02-27 MED ORDER — SODIUM CHLORIDE 0.9 % IV BOLUS
1000.0000 mL | Freq: Once | INTRAVENOUS | Status: DC
Start: 2022-02-27 — End: 2022-02-27

## 2022-02-27 MED ORDER — IOHEXOL 350 MG/ML SOLN
100.0000 mL | Freq: Once | INTRAVENOUS | Status: AC | PRN
  Administered 2022-02-27: 75 mL via INTRAVENOUS

## 2022-02-27 NOTE — ED Triage Notes (Signed)
Pt arrives pov with mother, slow gait to triage c/o witnessed loc this am. Also reports fever and weakness x 5 days. Reports unconscious for 2 min, pt did not fall per mother. Treated on 4/20 and 4/21 at [facility] for tonsillitis ?

## 2022-02-27 NOTE — Discharge Instructions (Addendum)
As we discussed, your work-up in the ER today was reassuring for acute abnormalities.  Please continue to take the medications that you were prescribed at urgent care as well as Tylenol/ibuprofen as needed for fevers.  Follow-up with your primary care doctor in the next few days for continued valuation management. ? ?Return if development of any new or worsening symptoms. ?

## 2022-02-27 NOTE — ED Notes (Signed)
Has been feeling "sick" since Thursday, having bilateral ear pain with throat pain. Friday began having a fever. Went to Urgent Care, went to another Urgent Care for a second opinion. Was told they would treat her for tonsillitis with abx. Here today since she is not feeling any better.  ?

## 2022-02-27 NOTE — ED Provider Notes (Signed)
?[place] [facility]MENT ?Provider Note ? ? ?CSN: [id number] ?Arrival date & time: 02/27/22  1337 ? ?  ? ?History ? ?Chief Complaint  ?Patient presents with  ? Loss of Consciousness  ? ? ?Heidi Calhoun is a 21 y.o. female. ? ?Patient with no pertinent past medical history presents today with complaints of syncope. She states that earlier today soon after she woke up this morning she was not feeling well and soon after she got out of bed she developed tunnel vision and diaphoresis and subsequently had a syncopal episode. States that her mom saw the event and caught her so she didn't fall. Additionally, patient states that she has been feeling generally unwell for the past several days with cough, congestion, and sore throat. Also states that she has been very fatigued lately and feeling weak with mild shortness of breath. Endorses fevers and chills as well and has been taking tylenol/ibuprofen regularly with some improvement. She also endorses bodyaches. She went to Urgent Care a few days ago and was swabbed for covid, flu, and strep all of which were negative, was given flonase for same without improvement. Returned to Urgent Care Friday and was given decadron and clindamycin and diagnosed with bacterial tonsillitis. She has been taking this as prescribed with minimal improvement. She does endorse some decreased oral intake recently due to sore throat and fatigue. She is able to swallow with some pain. Of note, patient recently switched from Depo shots to estrogen patch for birth control in January. She is currently on her menstrual cycle which she states is a normal time for her. ? ?The history is provided by the patient. No language interpreter was used.  ?Loss of Consciousness ?Associated symptoms: fever (not currently)   ?Associated symptoms: no confusion, no dizziness, no headaches, no nausea, no seizures, no vomiting and no weakness   ? ?  ? ?Home Medications ?Prior to Admission  medications   ?Medication Sig Start Date End Date Taking? Authorizing Provider  ?buPROPion (WELLBUTRIN SR) 150 MG 12 hr tablet Take 1 tablet (150 mg total) by mouth daily. 11/04/21 11/04/22  Merian Capron, MD  ?FLUoxetine HCl 60 MG TABS Take 60 mg by mouth daily. 09/06/21   Samuel Bouche, NP  ?fluticasone (FLONASE) 50 MCG/ACT nasal spray Place 2 sprays into both nostrils daily. 02/24/22   Raylene Everts, MD  ?norelgestromin-ethinyl estradiol Marilu Favre) 150-35 MCG/24HR transdermal patch Apply 1 patch each week for 3 weeks (21 total days); followed by 1 week that is patch-free. Each patch should be applied on the same day each week ("patch change day") and only 1 patch should be worn at a time. No more than 7 days should pass during the patch-free interval. 02/10/22   Samuel Bouche, NP  ?   ? ?Allergies    ?Amoxicillin   ? ?Review of Systems   ?Review of Systems  ?Constitutional:  Positive for fatigue and fever (not currently).  ?HENT:  Positive for congestion, rhinorrhea and sore throat. Negative for trouble swallowing and voice change.   ?Respiratory:  Positive for cough. Negative for wheezing and stridor.   ?Cardiovascular:  Positive for syncope.  ?Gastrointestinal:  Negative for abdominal pain, diarrhea, nausea and vomiting.  ?Musculoskeletal:  Negative for neck pain and neck stiffness.  ?Neurological:  Negative for dizziness, tremors, seizures, syncope, facial asymmetry, speech difficulty, weakness, light-headedness, numbness and headaches.  ?Psychiatric/Behavioral:  Negative for confusion and decreased concentration.   ?All other systems reviewed and are negative. ? ?Physical Exam ?Updated  Vital Signs ?BP 116/74   Pulse 88   Temp 98.7 ?F (37.1 ?C) (Oral)   Resp 16   Ht 5\' 2"  (1.575 m)   Wt 89.8 kg   LMP 02/26/2022   SpO2 100%   BMI 36.21 kg/m?  ?Physical Exam ?Vitals and nursing note reviewed.  ?Constitutional:   ?   General: She is not in acute distress. ?   Appearance: Normal appearance. She is normal  weight. She is not ill-appearing, toxic-appearing or diaphoretic.  ?   Comments: Patient resting comfortably in bed in no acute distress  ?HENT:  ?   Head: Normocephalic and atraumatic.  ?   Right Ear: Tympanic membrane, ear canal and external ear normal.  ?   Left Ear: Tympanic membrane, ear canal and external ear normal.  ?   Mouth/Throat:  ?   Mouth: Mucous membranes are moist.  ?   Pharynx: Oropharynx is clear. Uvula midline. No pharyngeal swelling, oropharyngeal exudate, posterior oropharyngeal erythema or uvula swelling.  ?   Tonsils: Tonsillar exudate present. 2+ on the right. 2+ on the left.  ?Neck:  ?   Comments: No meningismus ?Cardiovascular:  ?   Rate and Rhythm: Normal rate and regular rhythm.  ?Pulmonary:  ?   Effort: Pulmonary effort is normal. No respiratory distress.  ?   Breath sounds: Normal breath sounds.  ?Abdominal:  ?   General: Abdomen is flat.  ?   Palpations: Abdomen is soft.  ?Musculoskeletal:     ?   General: Normal range of motion.  ?   Cervical back: Normal range of motion and neck supple. No tenderness.  ?   Comments: No calf tenderness  ?Lymphadenopathy:  ?   Cervical: No cervical adenopathy.  ?Skin: ?   General: Skin is warm and dry.  ?Neurological:  ?   General: No focal deficit present.  ?   Mental Status: She is alert.  ?Psychiatric:     ?   Mood and Affect: Mood normal.     ?   Behavior: Behavior normal.  ? ? ?ED Results / Procedures / Treatments   ?Labs ?(all labs ordered are listed, but only abnormal results are displayed) ?Labs Reviewed  ?URINALYSIS, ROUTINE W REFLEX MICROSCOPIC - Abnormal; Notable for the following components:  ?    Result Value  ? Hgb urine dipstick LARGE (*)   ? Leukocytes,Ua TRACE (*)   ? All other components within normal limits  ?D-DIMER, QUANTITATIVE - Abnormal; Notable for the following components:  ? D-Dimer, Quant 0.60 (*)   ? All other components within normal limits  ?URINALYSIS, MICROSCOPIC (REFLEX) - Abnormal; Notable for the following  components:  ? Bacteria, UA RARE (*)   ? All other components within normal limits  ?BASIC METABOLIC PANEL  ?CBC  ?PREGNANCY, URINE  ?CBG MONITORING, ED  ? ? ?EKG ?None ? ?Radiology ?CT Angio Chest PE W and/or Wo Contrast ? ?Result Date: 02/27/2022 ?CLINICAL DATA:  Pulmonary embolism suspected fever and weakness for 5 days. EXAM: CT ANGIOGRAPHY CHEST WITH CONTRAST TECHNIQUE: Multidetector CT imaging of the chest was performed using the standard protocol during bolus administration of intravenous contrast. Multiplanar CT image reconstructions and MIPs were obtained to evaluate the vascular anatomy. RADIATION DOSE REDUCTION: This exam was performed according to the departmental dose-optimization program which includes automated exposure control, adjustment of the mA and/or kV according to patient size and/or use of iterative reconstruction technique. CONTRAST:  62mL OMNIPAQUE IOHEXOL 350 MG/ML SOLN COMPARISON:  None. FINDINGS:  Cardiovascular: Satisfactory opacification of the pulmonary arteries to the segmental level. No evidence of pulmonary embolism. Normal heart size. No pericardial effusion. Mediastinum/Nodes: No enlarged mediastinal, hilar, or axillary lymph nodes. Thyroid gland, trachea, and esophagus demonstrate no significant findings. Lungs/Pleura: Lungs are clear. No pleural effusion or pneumothorax. Upper Abdomen: No acute abnormality. Musculoskeletal: No chest wall abnormality. No acute or significant osseous findings. Review of the MIP images confirms the above findings. IMPRESSION: 1.  No evidence of pulmonary embolism. 2.  No evidence of acute cardiopulmonary process. Electronically Signed   By: Keane Police D.O.   On: 02/27/2022 16:55   ? ?Procedures ?Procedures  ? ? ?Medications Ordered in ED ?Medications  ?sodium chloride 0.9 % bolus 1,000 mL (1,000 mLs Intravenous New Bag/Given 02/27/22 1431)  ?iohexol (OMNIPAQUE) 350 MG/ML injection 100 mL (75 mLs Intravenous Contrast Given 02/27/22 1618)  ? ? ?ED  Course/ Medical Decision Making/ A&P ?  ?                        ?Medical Decision Making ?Amount and/or Complexity of Data Reviewed ?Labs: ordered. ?Radiology: ordered. ? ?Risk ?Prescription drug management. ? ? ?This patient present

## 2022-02-28 ENCOUNTER — Telehealth: Payer: Self-pay | Admitting: General Practice

## 2022-02-28 NOTE — Telephone Encounter (Signed)
Transition Care Management Unsuccessful Follow-up Telephone Call ? ?Date of discharge and from where:  02/27/22 from [facility] ? ?Attempts:  1st Attempt ? ?Reason for unsuccessful TCM follow-up call:  Left voice message ? ?  ?

## 2022-03-01 NOTE — Telephone Encounter (Signed)
Transition Care Management Unsuccessful Follow-up Telephone Call ? ?Date of discharge and from where:  02/27/22 from [facility] ? ?Attempts:  2nd Attempt ? ?Reason for unsuccessful TCM follow-up call:  Left voice message ? ?  ?

## 2022-03-02 NOTE — Telephone Encounter (Signed)
Transition Care Management Unsuccessful Follow-up Telephone Call ? ?Date of discharge and from where:  02/27/22 from [facility] ? ?Attempts:  3rd Attempt ? ?Reason for unsuccessful TCM follow-up call:  Left voice message ? ?  ?

## 2022-03-17 ENCOUNTER — Other Ambulatory Visit (HOSPITAL_COMMUNITY): Payer: Self-pay | Admitting: Psychiatry

## 2022-03-21 ENCOUNTER — Encounter (HOSPITAL_COMMUNITY): Payer: Self-pay | Admitting: Psychiatry

## 2022-03-21 ENCOUNTER — Telehealth (INDEPENDENT_AMBULATORY_CARE_PROVIDER_SITE_OTHER): Payer: Self-pay | Admitting: Psychiatry

## 2022-03-21 DIAGNOSIS — F331 Major depressive disorder, recurrent, moderate: Secondary | ICD-10-CM

## 2022-03-21 DIAGNOSIS — F419 Anxiety disorder, unspecified: Secondary | ICD-10-CM

## 2022-03-21 MED ORDER — BUPROPION HCL ER (SR) 150 MG PO TB12: 150.0000 mg | ORAL_TABLET | Freq: Every day | ORAL | 2 refills | Status: DC

## 2022-03-21 NOTE — Progress Notes (Signed)
[facility] Follow up visit ? ?Patient Identification: Heidi Calhoun ?MRN:  [id number] ?Date of Evaluation:  03/21/2022 ?Referral Source: primary care ?Chief Complaint:  follow up  ,depression ?Visit Diagnosis:  ?  ICD-10-CM   ?1. MDD (major depressive disorder), recurrent episode, moderate (HCC)  F33.1   ?  ?2. Anxiety disorder, unspecified type  F41.9   ?  ? ?Virtual Visit via Video Note ? ?I connected with Robet Leu on 03/21/22 at  3:00 PM EDT by a video enabled telemedicine application and verified that I am speaking with the correct person using two identifiers. ? ?Location: ?Patient: home ?Provider: home office ?  ?I discussed the limitations of evaluation and management by telemedicine and the availability of in person appointments. The patient expressed understanding and agreed to proceed. ? ?  ?I discussed the assessment and treatment plan with the patient. The patient was provided an opportunity to ask questions and all were answered. The patient agreed with the plan and demonstrated an understanding of the instructions. ?  ?The patient was advised to call back or seek an in-person evaluation if the symptoms worsen or if the condition fails to improve as anticipated. ? ?I provided 15 minutes of non-face-to-face time during this encounter. ? ? ?History of Present Illness: Patient is a 21 years old currently single female living with her mom and stepdad.  Currently not working ? ?Doing fair with depression, gone thru grief over her dog died one year ago ?Wellbutrin helps depression ?Her biological dad wants to connect but she wants to have time to processs and we talked about going in therapy ? ? ?Denies hallucinations ?Has dogs keep her busy ?Less communication with mom, everyone is busy ? ?Denies drug use ?Denies past psychiatric admission or treatment ? ? ?Aggravating factors : difficult childhood.  Work stress, dog died ?Modifying factors; her mom, 2 husky dogs , sister ? ?Severity fair ? ? ?Past  Psychiatric History: depression, anxiety ? ?Previous Psychotropic Medications: No  ? ? ? ?Past Medical History:  ?Past Medical History:  ?Diagnosis Date  ? Anxiety   ? Depression   ?  ?Past Surgical History:  ?Procedure Laterality Date  ? NO PAST SURGERIES    ? ? ?Family Psychiatric History: momL: depression; dad : alcohol use ? ?Family History:  ?Family History  ?Problem Relation Age of Onset  ? Hypertension Maternal Uncle   ? Diabetes Maternal Uncle   ? Diabetes Maternal Grandmother   ? Stroke Maternal Grandmother   ? Hypertension Maternal Grandfather   ? Kidney disease Paternal Grandfather   ? ? ?Social History:   ?Social History  ? ?Socioeconomic History  ? Marital status: Single  ?  Spouse name: Not on file  ? Number of children: Not on file  ? Years of education: Not on file  ? Highest education level: Not on file  ?Occupational History  ?  Employer: [employer]  ?Tobacco Use  ? Smoking status: Never  ? Smokeless tobacco: Never  ?Vaping Use  ? Vaping Use: Former  ?Substance and Sexual Activity  ? Alcohol use: Never  ? Drug use: Never  ? Sexual activity: Yes  ?  Birth control/protection: Injection  ?Other Topics Concern  ? Not on file  ?Social History Narrative  ? Not on file  ? ?Social Determinants of Health  ? ?Financial Resource Strain: Not on file  ?Food Insecurity: Not on file  ?Transportation Needs: Not on file  ?Physical Activity: Not on file  ?Stress: Not on file  ?  Social Connections: Not on file  ? ? ?Allergies:   ?Allergies  ?Allergen Reactions  ? Amoxicillin Hives  ? ? ?Metabolic Disorder Labs: ?No results found for: HGBA1C, MPG ?No results found for: PROLACTIN ?No results found for: CHOL, TRIG, HDL, CHOLHDL, VLDL, LDLCALC ?Lab Results  ?Component Value Date  ? TSH 1.05 01/27/2021  ? ? ?Therapeutic Level Labs: ?No results found for: LITHIUM ?No results found for: CBMZ ?No results found for: VALPROATE ? ?Current Medications: ?Current Outpatient Medications  ?Medication Sig Dispense Refill  ? buPROPion  (WELLBUTRIN SR) 150 MG 12 hr tablet Take 1 tablet (150 mg total) by mouth daily. 30 tablet 2  ? FLUoxetine HCl 60 MG TABS Take 60 mg by mouth daily. 90 tablet 1  ? fluticasone (FLONASE) 50 MCG/ACT nasal spray Place 2 sprays into both nostrils daily. 16 g 0  ? norelgestromin-ethinyl estradiol [facility]ico(XULANE) 150-35 MCG/24HR transdermal patch Apply 1 patch each week for 3 weeks (21 total days); followed by 1 week that is patch-free. Each patch should be applied on the same day each week ("patch change day") and only 1 patch should be worn at a time. No more than 7 days should pass during the patch-free interval. 3 patch 1  ? ?No current facility-administered medications for this visit.  ? ? ?Psychiatric Specialty Exam: ?Review of Systems  ?Cardiovascular:  Negative for chest pain.  ?Neurological:  Negative for tremors.  ?Psychiatric/Behavioral:  Negative for agitation, self-injury and suicidal ideas.    ?Last menstrual period 02/26/2022.There is no height or weight on file to calculate BMI.  ?General Appearance: Casual  ?Eye Contact:  Fair  ?Speech:  Normal Rate  ?Volume:  Decreased  ?Mood:  fair  ?Affect:  Congruent  ?Thought Process:  Goal Directed  ?Orientation:  Full (Time, Place, and Person)  ?Thought Content:  Rumination  ?Suicidal Thoughts:  No  ?Homicidal Thoughts:  No  ?Memory:  Immediate;   Fair  ?Judgement:  Fair  ?Insight:  Fair  ?Psychomotor Activity:  Decreased  ?Concentration:  Concentration: Fair  ?Recall:  Good  ?Fund of Knowledge:Good  ?Language: Fair  ?Akathisia:  NA  ?Handed:    ?AIMS (if indicated):  not done  ?Assets:  Communication Skills ?Desire for Improvement ?Housing ?Physical Health  ?ADL's:  Intact  ?Cognition: WNL  ?Sleep:  Poor  ? ?Screenings: ?GAD-7   ? ?Flowsheet Row [facility] [place] [facility] [place] [facility]  [place] [facility] [place] [facility] [place]  ?Total GAD-7 Score 11 11 13 8 9   ? ?  ? ?PHQ2-9   ? ?Flowsheet Row [facility] [place] Office Visit from 10/07/2021 in [facility] [place] Office [facility] from [facility] [place] [facility] from [facility]20[facility] [place] [facility] [place]  ?PHQ-2 Total Score 6 5 3 6 1   ?PHQ-9 Total Score 16 20 16 19 10   ? ?  ? ?Flowsheet Row Video Visit from 03/21/2022 in [facility] [place] ED [facility]  ?C-SSRS RISK CATEGORY No Risk No Risk No Risk  ? ?  ? ? ?Assessment and Plan: as follows ?Prior documentation reviewed ? ? ?Major depressive disorder recurrent moderate to severe; fair continue wellbutrin ? ? ?Generalized anxiety disorder manageable continue prozac ? Recommend therapy to deal with biological family and childhood concerns  ?Fu 12m ?Renewed wellbutrin ?Thresa Ross, MD ?5/15/20233:13 PM ? ?

## 2022-03-25 ENCOUNTER — Ambulatory Visit: Payer: 59 | Admitting: Medical-Surgical

## 2022-06-15 ENCOUNTER — Encounter (HOSPITAL_COMMUNITY): Payer: Self-pay

## 2022-06-15 ENCOUNTER — Telehealth (HOSPITAL_COMMUNITY): Payer: 59 | Admitting: Psychiatry

## 2022-06-24 ENCOUNTER — Other Ambulatory Visit (HOSPITAL_COMMUNITY): Payer: Self-pay | Admitting: Psychiatry

## 2022-08-21 ENCOUNTER — Encounter: Payer: Self-pay | Admitting: Medical-Surgical

## 2022-08-24 ENCOUNTER — Other Ambulatory Visit: Payer: Self-pay | Admitting: Medical-Surgical

## 2022-08-24 ENCOUNTER — Encounter: Payer: Self-pay | Admitting: Medical-Surgical

## 2022-08-24 ENCOUNTER — Other Ambulatory Visit (HOSPITAL_COMMUNITY): Payer: Self-pay | Admitting: Psychiatry

## 2022-08-25 MED ORDER — FLUOXETINE HCL 60 MG PO TABS: 60.0000 mg | ORAL_TABLET | Freq: Every day | ORAL | 0 refills | Status: DC

## 2022-08-25 NOTE — Telephone Encounter (Signed)
Last office visit 02/10/2022  Last filled [facility]/24/[facility]  Is it okay to refill?

## 2022-09-13 ENCOUNTER — Encounter: Payer: Self-pay | Admitting: Medical-Surgical

## 2022-09-13 ENCOUNTER — Ambulatory Visit (INDEPENDENT_AMBULATORY_CARE_PROVIDER_SITE_OTHER): Payer: Self-pay | Admitting: Medical-Surgical

## 2022-09-13 VITALS — BP 106/72 | HR 76 | Ht 62.0 in | Wt 196.0 lb

## 2022-09-13 DIAGNOSIS — F324 Major depressive disorder, single episode, in partial remission: Secondary | ICD-10-CM

## 2022-09-13 DIAGNOSIS — F419 Anxiety disorder, unspecified: Secondary | ICD-10-CM

## 2022-09-13 MED ORDER — BUPROPION HCL ER (XL) 150 MG PO TB24: 150.0000 mg | ORAL_TABLET | Freq: Every day | ORAL | 1 refills | Status: DC

## 2022-09-13 NOTE — Progress Notes (Signed)
Established Patient Office Visit  Subjective   Patient ID: Heidi Calhoun, female   DOB: 06/09/2001 Age: 21 y.o. MRN: [id number]   Chief Complaint  Patient presents with   Depression    HPI Pleasant 21 year old female presenting today to discuss depression.  She has been taking fluoxetine 60 mg daily, tolerating well without side effects.  Previously taking Wellbutrin 150 mg daily and notes this was very helpful but has not been taking it recently.  Was previously seeing Dr. De Nurse downstairs with psychiatry but does not want to go back to see him anymore.  Reports that her mom was very upset with his care because he did not seem to want to diagnose her with regular depression or bipolar depression.  Continues to have issues with significant depression altered with periods of increased activity/energy, increased irritability, and poor choices (specifically overspending).  Reports that she is looking into going back to school and has the idea that she would like to be a Pharmacist, hospital, specifically elementary school.  Denies SI/HI but does admit that there are times she would like to not exist for a while as a means of mental escape.   Objective:    Vitals:   09/13/22 1400  BP: 106/72  Pulse: 76  Height: 5\' 2"  (1.575 m)  Weight: 196 lb (88.9 kg)  SpO2: 100%  BMI (Calculated): 35.84    Physical Exam Vitals and nursing note reviewed.  Constitutional:      General: She is not in acute distress.    Appearance: Normal appearance. She is obese. She is not ill-appearing.  HENT:     Head: Normocephalic and atraumatic.  Cardiovascular:     Rate and Rhythm: Normal rate and regular rhythm.     Pulses: Normal pulses.     Heart sounds: Normal heart sounds.  Pulmonary:     Effort: Pulmonary effort is normal. No respiratory distress.     Breath sounds: Normal breath sounds. No wheezing, rhonchi or rales.  Skin:    General: Skin is warm and dry.  Neurological:     Mental Status: She is alert  and oriented to person, place, and time.  Psychiatric:        Mood and Affect: Mood normal.        Behavior: Behavior normal.        Thought Content: Thought content normal.        Judgment: Judgment normal.   No results found for this or any previous visit (from the past 24 hour(s)).     The ASCVD Risk score (Arnett DK, et al., 2019) failed to calculate for the following reasons:   The 2019 ASCVD risk score is only valid for ages 10 to 65   Assessment & Plan:   1. Major depressive disorder with single episode, in partial remission ([place]) 2. Anxiety Discussed various options for management of her mental health concerns.  Reviewed the diagnostic criteria for bipolar disorder type I and II.  She does not meet these criteria specifically so advised that we would be unable to diagnose a specific bipolar disorder.  Her symptoms are more consistent with general anxiety disorder as well as major depressive disorder at this point.  Continue fluoxetine 60 mg daily.  Restart Wellbutrin 150 mg daily since this was helpful before.  Advised that we may need to look at alternatives such as a mood stabilizer if the current plan is unhelpful for managing her symptoms.  Patient verbalized understanding is agreeable to the  plan.  Return in about 6 weeks (around 10/25/2022) for mood follow up. ___________________________________________ Clearnce Sorrel, DNP, APRN, FNP-BC [facility]

## 2022-10-17 ENCOUNTER — Ambulatory Visit: Payer: Self-pay | Admitting: Medical-Surgical

## 2022-10-25 ENCOUNTER — Ambulatory Visit (INDEPENDENT_AMBULATORY_CARE_PROVIDER_SITE_OTHER): Payer: Self-pay | Admitting: Medical-Surgical

## 2022-10-25 ENCOUNTER — Encounter: Payer: Self-pay | Admitting: Medical-Surgical

## 2022-10-25 VITALS — BP 122/74 | HR 73 | Resp 20 | Ht 62.0 in | Wt 198.4 lb

## 2022-10-25 DIAGNOSIS — F419 Anxiety disorder, unspecified: Secondary | ICD-10-CM

## 2022-10-25 DIAGNOSIS — F324 Major depressive disorder, single episode, in partial remission: Secondary | ICD-10-CM

## 2022-10-25 DIAGNOSIS — R1013 Epigastric pain: Secondary | ICD-10-CM

## 2022-10-25 MED ORDER — FAMOTIDINE 20 MG PO TABS: 20.0000 mg | ORAL_TABLET | Freq: Two times a day (BID) | ORAL | 1 refills | Status: DC

## 2022-10-25 MED ORDER — FLUOXETINE HCL 60 MG PO TABS: 60.0000 mg | ORAL_TABLET | Freq: Every day | ORAL | 1 refills | Status: DC

## 2022-10-25 MED ORDER — BUPROPION HCL ER (XL) 150 MG PO TB24: 150.0000 mg | ORAL_TABLET | Freq: Every day | ORAL | 1 refills | Status: DC

## 2022-10-25 NOTE — Progress Notes (Signed)
Established Patient Office Visit  Subjective   Patient ID: Heidi Calhoun, female   DOB: 04-24-2001 Age: 21 y.o. MRN: [id number]   Chief Complaint  Patient presents with   Follow-up    Mood   HPI Very pleasant 21 year old female presenting today for mood follow-up.  She is taking fluoxetine 60 mg daily along with Wellbutrin 150 mg daily, tolerating both medications well without side effects.  Has been on the medications again for about 6 weeks and notes that the medications seem to be working fairly well for her.  Notes that she feels a lot better on the medications and feels that her symptoms are well-controlled.  Does note that she has quite a bit of fatigue but attributes this to working 12-hour shifts, up to 60 hours/week.  Denies SI/HI.  Has had some issues with epigastric discomfort lately.  Has reflux intermittently.  Noted that she was in the car 1 day and driving when she had reflux, all the way up into the back of her throat.  She was able to hold down but shortly after, had an episode where she vomited profusely.  Has not had any repeat episodes of vomiting and did not see any blood in her emesis.  No blood in her stool but endorses some intermittent constipation issues.  Objective:    Vitals:   10/25/22 1437  BP: 122/74  Pulse: 73  Resp: 20  Height: 5\' 2"  (1.575 m)  Weight: 198 lb 6.4 oz (90 kg)  SpO2: 99%  BMI (Calculated): 36.28      10/25/2022    3:39 PM 09/13/2022    2:47 PM 02/10/2022    5:04 PM 10/07/2021    9:25 AM 10/07/2021    9:08 AM  Depression screen PHQ 2/9  Decreased Interest 1 1 3     Down, Depressed, Hopeless 1 2 3     PHQ - 2 Score 2 3 6     Altered sleeping 3 2 3     Tired, decreased energy 3 2 3     Change in appetite 3 3 1     Feeling bad or failure about yourself  2 3 1     Trouble concentrating 1 1 2     Moving slowly or fidgety/restless 1 1 0    Suicidal thoughts 0 0 0    PHQ-9 Score 15 15 16     Difficult doing work/chores Somewhat difficult  Somewhat difficult Somewhat difficult       Information is confidential and restricted. Go to Review Flowsheets to unlock data.      10/25/2022    3:39 PM 09/13/2022    2:47 PM 02/10/2022    5:05 PM 09/06/2021   10:54 AM  GAD 7 : Generalized Anxiety Score  Nervous, Anxious, on Edge 1 1 2 2   Control/stop worrying 1 1 1 1   Worry too much - different things 2 1 2 1   Trouble relaxing 2 2 2 3   Restless 0 2 1 2   Easily annoyed or irritable 2 2 3 2   Afraid - awful might happen 0 0 0 0  Total GAD 7 Score 8 9 11 11   Anxiety Difficulty Somewhat difficult Somewhat difficult Somewhat difficult Somewhat difficult   Physical Exam Vitals and nursing note reviewed.  Constitutional:      General: She is not in acute distress.    Appearance: Normal appearance. She is not ill-appearing.  HENT:     Head: Normocephalic and atraumatic.  Cardiovascular:     Rate and Rhythm:  Normal rate and regular rhythm.     Pulses: Normal pulses.     Heart sounds: Normal heart sounds.  Pulmonary:     Effort: Pulmonary effort is normal. No respiratory distress.     Breath sounds: Normal breath sounds. No wheezing, rhonchi or rales.  Skin:    General: Skin is warm and dry.  Neurological:     Mental Status: She is alert and oriented to person, place, and time.  Psychiatric:        Mood and Affect: Mood normal.        Behavior: Behavior normal.        Thought Content: Thought content normal.        Judgment: Judgment normal.   No results found for this or any previous visit (from the past 24 hour(s)).     The ASCVD Risk score (Arnett DK, et al., 2019) failed to calculate for the following reasons:   The 2019 ASCVD risk score is only valid for ages 76 to 60   Assessment & Plan:   1. Major depressive disorder with single episode, in partial remission ([facility]) 2. Anxiety Patient reports symptoms are much better however her PHQ9/GAD-7 scores are somewhat the same.  At this point, she is very happy with her  regimen and feels like it is working well so we are not making any changes.  Continue fluoxetine 60 mg and Wellbutrin 150 mg daily as prescribed.  If something changes and she feels her symptoms are no longer well-managed, return for further evaluation.  3. Epigastric pain Symptoms do seem to be related to reflux.  Adding famotidine 20 mg twice daily as needed.  Discussed home management of constipation concerns.  Would benefit from adding a fiber supplement or daily stool softener.   Return in about 6 months (around 04/26/2023) for annual physical exam.  ___________________________________________ Thayer Ohm, DNP, APRN, FNP-BC [facility] [employer] [facility] [place]

## 2022-12-21 ENCOUNTER — Encounter: Payer: Self-pay | Admitting: Medical-Surgical

## 2023-02-09 ENCOUNTER — Encounter: Payer: Self-pay | Admitting: Medical-Surgical

## 2023-02-13 MED ORDER — ACYCLOVIR 5 % EX OINT: TOPICAL_OINTMENT | CUTANEOUS | 1 refills | Status: DC

## 2023-03-03 ENCOUNTER — Ambulatory Visit: Payer: Self-pay | Admitting: Medical-Surgical

## 2023-04-25 ENCOUNTER — Ambulatory Visit (INDEPENDENT_AMBULATORY_CARE_PROVIDER_SITE_OTHER): Payer: 59 | Admitting: Medical-Surgical

## 2023-04-25 ENCOUNTER — Encounter: Payer: Self-pay | Admitting: Medical-Surgical

## 2023-04-25 VITALS — BP 109/67 | HR 79 | Resp 20 | Ht 62.0 in | Wt 210.0 lb

## 2023-04-25 DIAGNOSIS — F32A Depression, unspecified: Secondary | ICD-10-CM

## 2023-04-25 DIAGNOSIS — F324 Major depressive disorder, single episode, in partial remission: Secondary | ICD-10-CM

## 2023-04-25 DIAGNOSIS — Z1329 Encounter for screening for other suspected endocrine disorder: Secondary | ICD-10-CM

## 2023-04-25 DIAGNOSIS — F419 Anxiety disorder, unspecified: Secondary | ICD-10-CM | POA: Diagnosis not present

## 2023-04-25 DIAGNOSIS — Z0000 Encounter for general adult medical examination without abnormal findings: Secondary | ICD-10-CM | POA: Diagnosis not present

## 2023-04-25 DIAGNOSIS — Z13220 Encounter for screening for lipoid disorders: Secondary | ICD-10-CM | POA: Diagnosis not present

## 2023-04-25 DIAGNOSIS — R5383 Other fatigue: Secondary | ICD-10-CM

## 2023-04-25 DIAGNOSIS — Z131 Encounter for screening for diabetes mellitus: Secondary | ICD-10-CM

## 2023-04-25 NOTE — Progress Notes (Signed)
Complete physical exam  Patient: Heidi Calhoun   DOB: 08-Sep-2001   22 y.o. Female  MRN: [id number]  Subjective:    Chief Complaint  Patient presents with   Annual Exam   Heidi Calhoun is a 22 y.o. female who presents today for a complete physical exam. She reports consuming a general diet. The patient does not participate in regular exercise at present. She generally feels fairly well. She reports sleeping too much. She does not have additional problems to discuss today.   Most recent fall risk assessment:    04/25/2023   11:33 AM  Fall Risk   Falls in the past year? 1  Number falls in past yr: 0  Injury with Fall? 1  Risk for fall due to : History of fall(s)  Follow up Falls evaluation completed     Most recent depression screenings:    04/25/2023   11:33 AM 10/25/2022    3:39 PM  PHQ 2/9 Scores  PHQ - 2 Score 2 2  PHQ- 9 Score 12 15    Vision:Not within last year , Dental: No current dental problems and Receives regular dental care, and STD: The patient denies history of sexually transmitted disease.    Patient Care Team: Christen Butter, NP as PCP - General (Nurse Practitioner)   Outpatient Medications Prior to Visit  Medication Sig   buPROPion (WELLBUTRIN XL) 150 MG 24 hr tablet Take 1 tablet (150 mg total) by mouth daily.   FLUoxetine HCl 60 MG TABS Take 60 mg by mouth daily.   [DISCONTINUED] acyclovir ointment (ZOVIRAX) 5 % Apply topically to the affected area every three hours while awake. (Patient not taking: Reported on 04/25/2023)   [DISCONTINUED] famotidine (PEPCID) 20 MG tablet Take 1 tablet (20 mg total) by mouth 2 (two) times daily. (Patient not taking: Reported on 04/25/2023)   No facility-administered medications prior to visit.    Review of Systems  Constitutional:  Negative for chills, fever, malaise/fatigue and weight loss.  HENT:  Negative for congestion, ear pain, hearing loss, sinus pain and sore throat.   Eyes:  Negative for blurred  vision, photophobia and pain.  Respiratory:  Negative for cough, shortness of breath and wheezing.   Cardiovascular:  Negative for chest pain, palpitations and leg swelling.  Gastrointestinal:  Negative for abdominal pain, constipation, diarrhea, heartburn, nausea and vomiting.  Genitourinary:  Negative for dysuria, frequency and urgency.  Musculoskeletal:  Negative for falls and neck pain.  Skin:  Negative for itching and rash.  Neurological:  Negative for dizziness, weakness and headaches.  Endo/Heme/Allergies:  Negative for polydipsia. Does not bruise/bleed easily.  Psychiatric/Behavioral:  Positive for depression. Negative for substance abuse and suicidal ideas. The patient is nervous/anxious. The patient does not have insomnia.      Objective:    BP 109/67 (BP Location: Right Arm, Cuff Size: Normal)   Pulse 79   Resp 20   Ht 5\' 2"  (1.575 m)   Wt 210 lb (95.3 kg)   SpO2 99%   BMI 38.41 kg/m    Physical Exam Vitals reviewed.  Constitutional:      General: She is not in acute distress.    Appearance: Normal appearance. She is obese. She is not ill-appearing.  HENT:     Head: Normocephalic and atraumatic.     Right Ear: Tympanic membrane, ear canal and external ear normal. There is no impacted cerumen.     Left Ear: Tympanic membrane, ear canal and external ear normal.  There is no impacted cerumen.     Nose: Nose normal. No congestion or rhinorrhea.     Mouth/Throat:     Mouth: Mucous membranes are moist.     Pharynx: No oropharyngeal exudate or posterior oropharyngeal erythema.  Eyes:     General: No scleral icterus.       Right eye: No discharge.        Left eye: No discharge.     Extraocular Movements: Extraocular movements intact.     Conjunctiva/sclera: Conjunctivae normal.     Pupils: Pupils are equal, round, and reactive to light.  Neck:     Thyroid: No thyromegaly.     Vascular: No carotid bruit or JVD.     Trachea: Trachea normal.  Cardiovascular:     Rate  and Rhythm: Normal rate and regular rhythm.     Pulses: Normal pulses.     Heart sounds: Normal heart sounds. No murmur heard.    No friction rub. No gallop.  Pulmonary:     Effort: Pulmonary effort is normal. No respiratory distress.     Breath sounds: Normal breath sounds. No wheezing.  Abdominal:     General: Bowel sounds are normal. There is no distension.     Palpations: Abdomen is soft.     Tenderness: There is no abdominal tenderness. There is no guarding.  Musculoskeletal:        General: Normal range of motion.     Cervical back: Normal range of motion and neck supple.  Lymphadenopathy:     Cervical: No cervical adenopathy.  Skin:    General: Skin is warm and dry.  Neurological:     Mental Status: She is alert and oriented to person, place, and time.     Cranial Nerves: No cranial nerve deficit.  Psychiatric:        Mood and Affect: Mood normal.        Behavior: Behavior normal.        Thought Content: Thought content normal.        Judgment: Judgment normal.     No results found for any visits on 04/25/23.     Assessment & Plan:    Routine Health Maintenance and Physical Exam  Immunization History  Administered Date(s) Administered   Hepatitis B, ADULT 12/04/2021   Influenza,inj,Quad PF,6+ Mos 08/09/2021   Influenza-Unspecified 10/02/2019, 06/21/2022   PFIZER(Purple Top)SARS-COV-2 Vaccination 01/24/2020, 02/14/2020, 09/29/2020   Tdap 07/09/2020    Health Maintenance  Topic Date Due   HPV VACCINES (1 - 2-dose series) Never done   COVID-19 Vaccine (4 - 2023-24 season) 05/11/2023 (Originally 07/08/2022)   INFLUENZA VACCINE  06/08/2023   PAP-Cervical Cytology Screening  06/14/2024   PAP SMEAR-Modifier  06/14/2024   DTaP/Tdap/Td (2 - Td or Tdap) 07/09/2030   Hepatitis C Screening  Completed   HIV Screening  Completed    Discussed health benefits of physical activity, and encouraged her to engage in regular exercise appropriate for her age and  condition.  1. Annual physical exam Checking labs as below.  Up-to-date on preventative care.  Wellness information provided with AVS. - CBC with Differential/Platelet - COMPLETE METABOLIC PANEL WITH GFR - Lipid panel  2. Lipid screening Checking lipids today. - Lipid panel  3. Depression, unspecified depression type 4. Anxiety Continues to have significant issues with depressive symptoms but feels this may be related more to fatigue.  Is very busy with work and school.  Feels that she is doing well on fluoxetine 60 mg  and Wellbutrin 150 mg daily.  We did briefly review the option for increasing Wellbutrin to see if this helps with her fatigue but she would like to keep things like they are for now.  Denies SI/HI.  Recommend working on regular intentional exercise as well as weight loss to healthy weight which may help her symptoms in the end.  5. Major depressive disorder with single episode, in partial remission (HCC) See above.  6. Diabetes mellitus screening Hemoglobin A1c. - Hemoglobin A1c  7. Thyroid disorder screen Checking TSH. - TSH  8. Fatigue, unspecified type Adding vitamin D to labs today. - VITAMIN D 25 Hydroxy (Vit-D Deficiency, Fractures)  Return in 6 months (on 10/25/2023) for mood follow up.   Christen Butter, NP

## 2023-04-26 LAB — LIPID PANEL
Cholesterol: 179 mg/dL (ref <200)
HDL: 44 mg/dL — ABNORMAL LOW (ref >=50)
LDL Cholesterol (Calc): 98 mg/dL (calc)
Non-HDL Cholesterol (Calc): 135 mg/dL (calc) — ABNORMAL HIGH (ref <130)
Total CHOL/HDL Ratio: 4.1 (calc) (ref <5.0)
Triglycerides: 246 mg/dL — ABNORMAL HIGH (ref <150)

## 2023-04-26 LAB — CBC WITH DIFFERENTIAL/PLATELET
Absolute Monocytes: 585 cells/uL (ref 200–950)
Basophils Absolute: 52 cells/uL (ref 0–200)
Basophils Relative: 0.7 %
Eosinophils Absolute: 59 cells/uL (ref 15–500)
Eosinophils Relative: 0.8 %
HCT: 37.3 % (ref 35.0–45.0)
Hemoglobin: 12.3 g/dL (ref 11.7–15.5)
Lymphs Abs: 2250 cells/uL (ref 850–3900)
MCH: 29.4 pg (ref 27.0–33.0)
MCHC: 33 g/dL (ref 32.0–36.0)
MCV: 89.2 fL (ref 80.0–100.0)
MPV: 10 fL (ref 7.5–12.5)
Monocytes Relative: 7.9 %
Neutro Abs: 4455 cells/uL (ref 1500–7800)
Neutrophils Relative %: 60.2 %
Platelets: 281 10*3/uL (ref 140–400)
RBC: 4.18 10*6/uL (ref 3.80–5.10)
RDW: 13 % (ref 11.0–15.0)
Total Lymphocyte: 30.4 %
WBC: 7.4 10*3/uL (ref 3.8–10.8)

## 2023-04-26 LAB — COMPLETE METABOLIC PANEL WITH GFR
AG Ratio: 1.6 (calc) (ref 1.0–2.5)
ALT: 15 U/L (ref 6–29)
AST: 13 U/L (ref 10–30)
Albumin: 4.4 g/dL (ref 3.6–5.1)
Alkaline phosphatase (APISO): 67 U/L (ref 31–125)
BUN: 11 mg/dL (ref 7–25)
CO2: 25 mmol/L (ref 20–32)
Calcium: 9.4 mg/dL (ref 8.6–10.2)
Chloride: 105 mmol/L (ref 98–110)
Creat: 0.57 mg/dL (ref 0.50–0.96)
Globulin: 2.8 g/dL (calc) (ref 1.9–3.7)
Glucose, Bld: 90 mg/dL (ref 65–99)
Potassium: 4.3 mmol/L (ref 3.5–5.3)
Sodium: 139 mmol/L (ref 135–146)
Total Bilirubin: 0.5 mg/dL (ref 0.2–1.2)
Total Protein: 7.2 g/dL (ref 6.1–8.1)
eGFR: 130 mL/min/{1.73_m2} (ref >=60)

## 2023-04-26 LAB — VITAMIN D 25 HYDROXY (VIT D DEFICIENCY, FRACTURES): Vit D, 25-Hydroxy: 29 ng/mL — ABNORMAL LOW (ref 30–100)

## 2023-04-26 LAB — HEMOGLOBIN A1C
Hgb A1c MFr Bld: 5.8 % of total Hgb — ABNORMAL HIGH (ref <5.7)
Mean Plasma Glucose: 120 mg/dL
eAG (mmol/L): 6.6 mmol/L

## 2023-04-26 LAB — TSH: TSH: 0.98 mIU/L

## 2023-06-06 ENCOUNTER — Ambulatory Visit: Payer: 59 | Admitting: Medical-Surgical

## 2023-06-13 IMAGING — CT CT ANGIO CHEST
2 of 10 series · 18 of 36 positions shown · IV contrast (agent unspecified)
Comparison: None.

CLINICAL DATA: Pulmonary embolism suspected fever and weakness for
5 days.

EXAM:
CT ANGIOGRAPHY CHEST WITH CONTRAST
TECHNIQUE: Multidetector CT imaging of the chest was performed using the
standard protocol during bolus administration of intravenous
contrast. Multiplanar CT image reconstructions and MIPs were
obtained to evaluate the vascular anatomy.

[Series 7: pe thins · axial · 0.69mm/px · z∈[-268,-53]mm · 17 of 243 slices shown]
[im 14/243  lung]
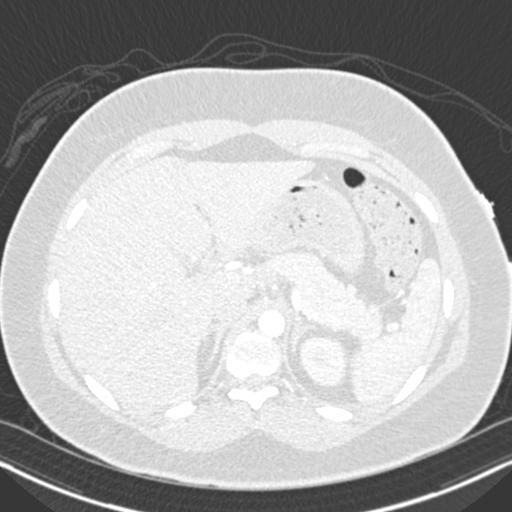
[im 27/243  mediastinal]
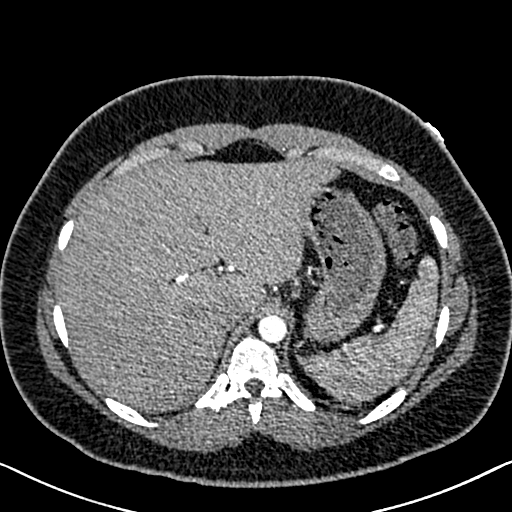
[im 41/243  lung]
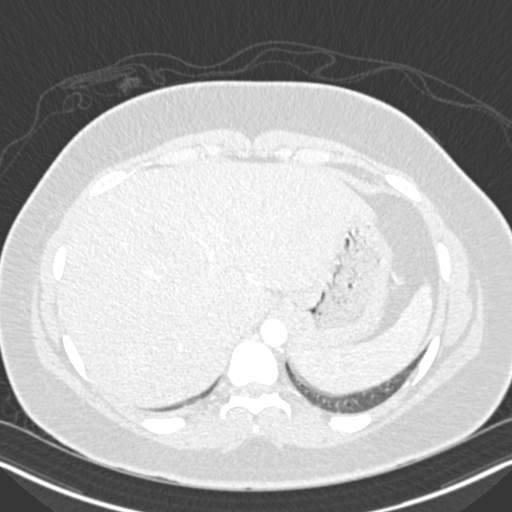
[im 54/243  mediastinal]
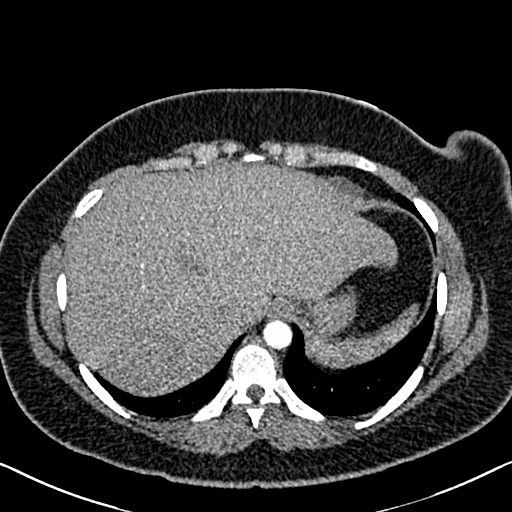
[im 68/243  lung]
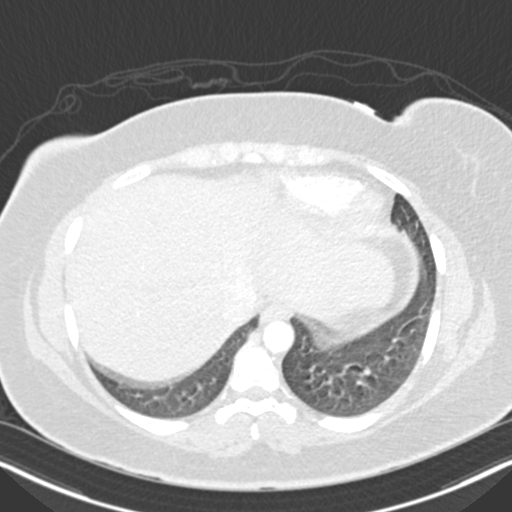
[im 81/243  mediastinal]
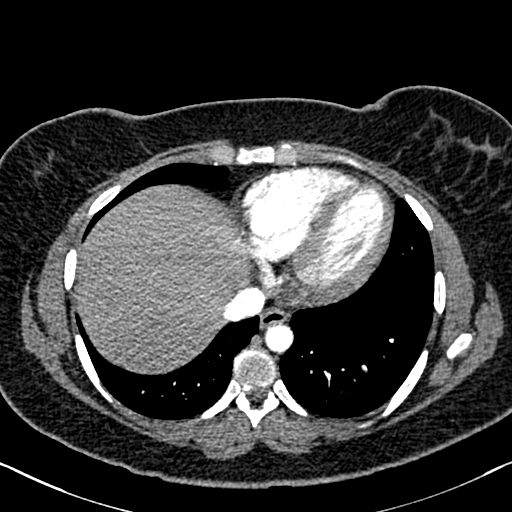
[im 95/243  lung]
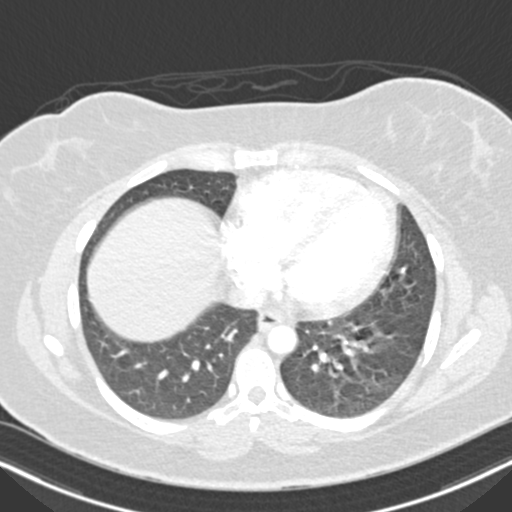
[im 108/243  mediastinal]
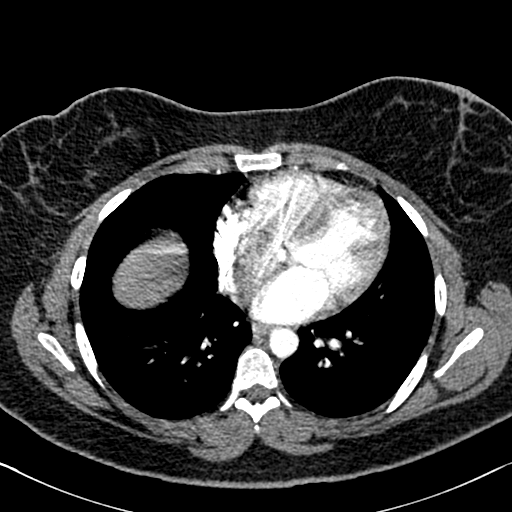
[im 122/243  lung]
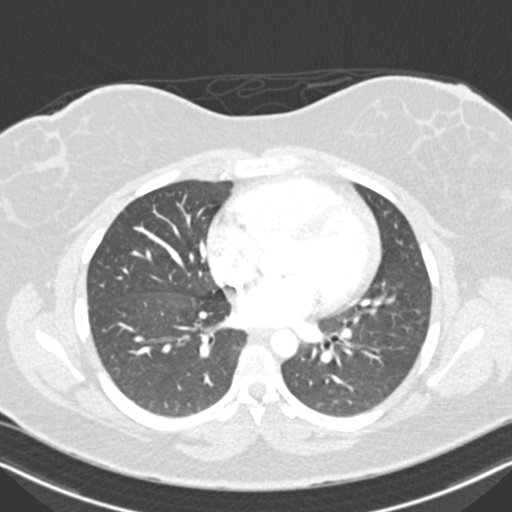
[im 135/243  mediastinal]
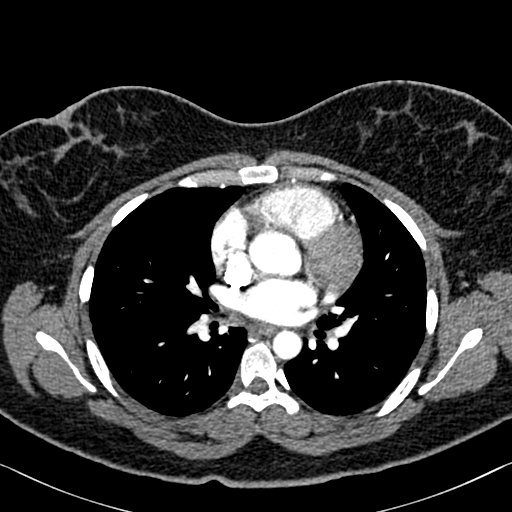
[im 148/243  lung]
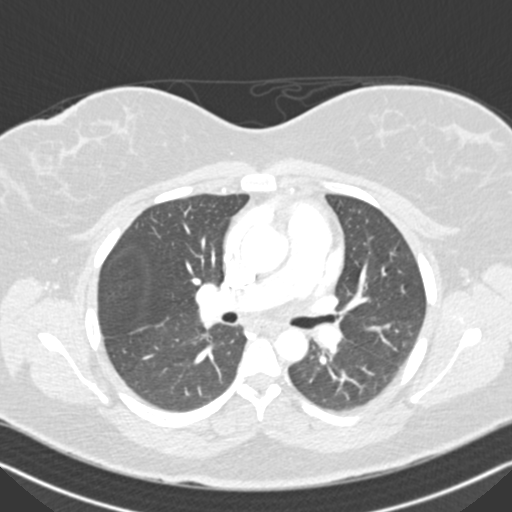
[im 162/243  mediastinal]
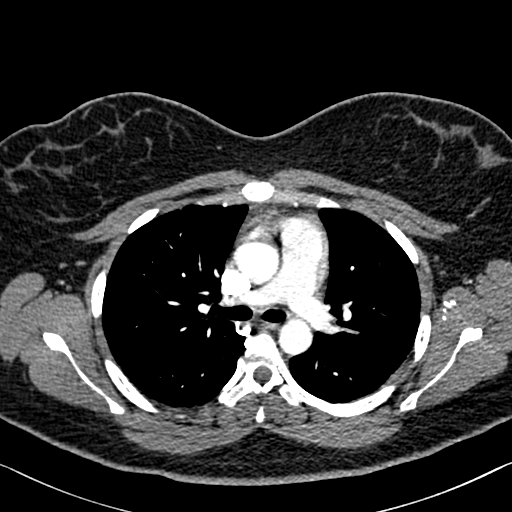
[im 175/243  lung]
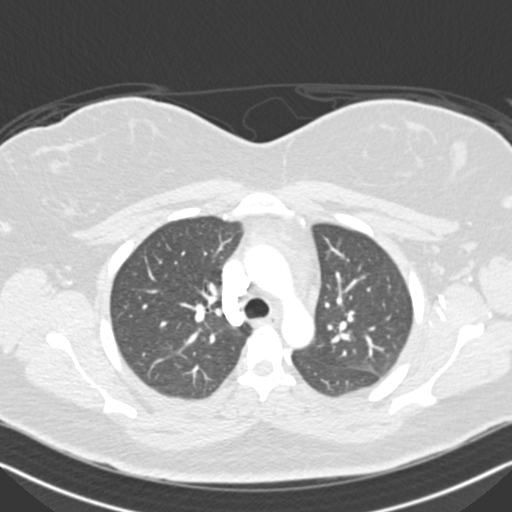
[im 189/243  mediastinal]
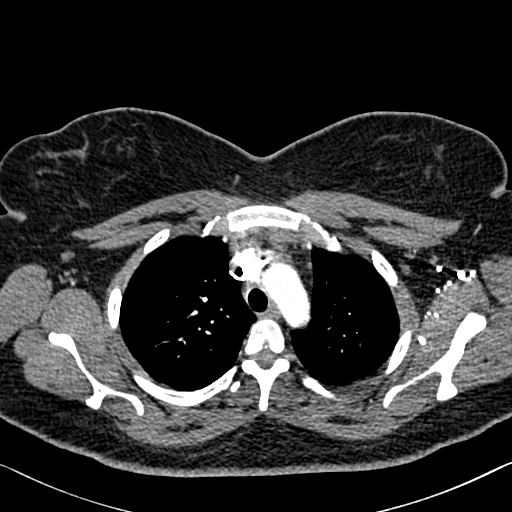
[im 202/243  lung]
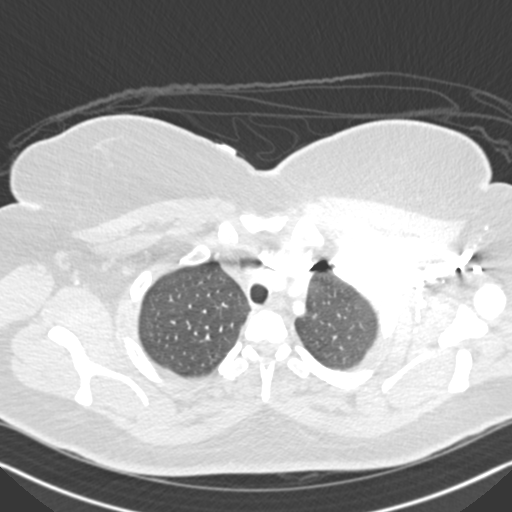
[im 216/243  mediastinal]
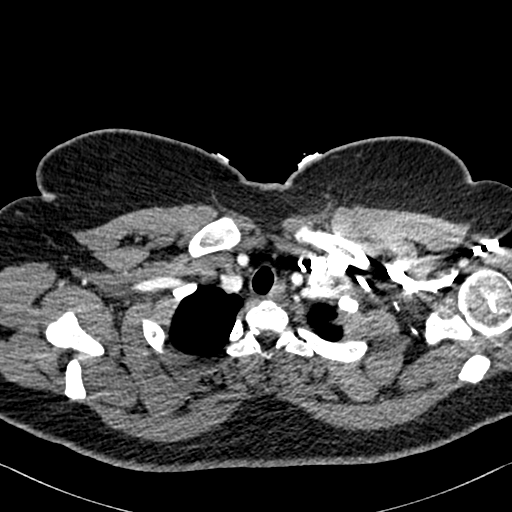
[im 229/243  lung]
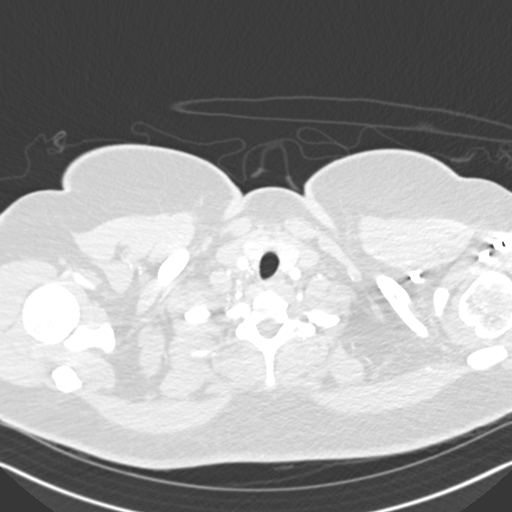

[Series 9: pe coronal mpr · coronal · 0.50mm/px · 1 of 151 slices shown]
[im 76/151  mediastinal]
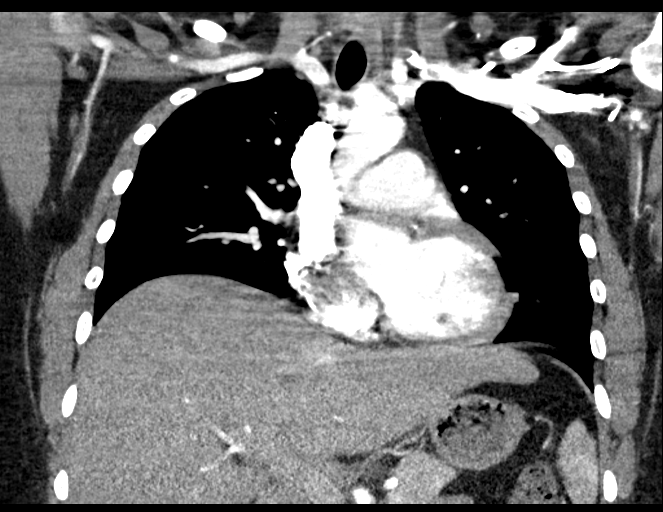

[18 of 36 positions shown; findings below may reference images not displayed]

RADIATION DOSE REDUCTION: This exam was performed according to the
departmental dose-optimization program which includes automated
exposure control, adjustment of the mA and/or kV according to
patient size and/or use of iterative reconstruction technique.

CONTRAST:  75mL OMNIPAQUE IOHEXOL 350 MG/ML SOLN
FINDINGS: Cardiovascular: Satisfactory opacification of the pulmonary arteries
to the segmental level. No evidence of pulmonary embolism. Normal
heart size. No pericardial effusion.

Mediastinum/Nodes: No enlarged mediastinal, hilar, or axillary lymph
nodes. Thyroid gland, trachea, and esophagus demonstrate no
significant findings.

Lungs/Pleura: Lungs are clear. No pleural effusion or pneumothorax.

Upper Abdomen: No acute abnormality.

Musculoskeletal: No chest wall abnormality. No acute or significant
osseous findings.

Review of the MIP images confirms the above findings.
IMPRESSION: 1.  No evidence of pulmonary embolism.

2.  No evidence of acute cardiopulmonary process.

## 2023-07-12 ENCOUNTER — Encounter: Payer: Self-pay | Admitting: Medical-Surgical

## 2023-07-12 MED ORDER — VALACYCLOVIR HCL 1 G PO TABS: 2000.0000 mg | ORAL_TABLET | Freq: Two times a day (BID) | ORAL | 0 refills | Status: AC

## 2023-07-12 MED ORDER — ACYCLOVIR 5 % EX OINT: TOPICAL_OINTMENT | CUTANEOUS | 1 refills | Status: DC

## 2023-07-12 NOTE — Addendum Note (Signed)
Addended byChristen Butter on: 07/12/2023 07:57 PM   Modules accepted: Orders

## 2023-10-25 ENCOUNTER — Other Ambulatory Visit: Payer: Self-pay

## 2023-10-25 ENCOUNTER — Encounter (HOSPITAL_BASED_OUTPATIENT_CLINIC_OR_DEPARTMENT_OTHER): Payer: Self-pay

## 2023-10-25 ENCOUNTER — Emergency Department (HOSPITAL_BASED_OUTPATIENT_CLINIC_OR_DEPARTMENT_OTHER)
Admission: EM | Admit: 2023-10-25 | Discharge: 2023-10-25 | Discharge status: Against Medical Advice | Payer: 59 | Attending: Emergency Medicine | Admitting: Emergency Medicine

## 2023-10-25 DIAGNOSIS — S61452A Open bite of left hand, initial encounter: Secondary | ICD-10-CM | POA: Insufficient documentation

## 2023-10-25 DIAGNOSIS — Z5321 Procedure and treatment not carried out due to patient leaving prior to being seen by health care provider: Secondary | ICD-10-CM | POA: Diagnosis not present

## 2023-10-25 DIAGNOSIS — W5501XA Bitten by cat, initial encounter: Secondary | ICD-10-CM | POA: Diagnosis not present

## 2023-10-25 NOTE — ED Triage Notes (Signed)
Pt arrives with c/o cat bite on her left hand that happened today. Per pt, cat was a stray. Pt has a small puncture wound to left hand. No bleeding noted.

## 2023-11-03 ENCOUNTER — Other Ambulatory Visit: Payer: Self-pay | Admitting: Medical-Surgical

## 2023-11-22 ENCOUNTER — Encounter: Payer: Self-pay | Admitting: Medical-Surgical

## 2023-11-22 MED ORDER — ACYCLOVIR 5 % EX OINT: TOPICAL_OINTMENT | CUTANEOUS | 1 refills | Status: AC

## 2024-01-08 ENCOUNTER — Encounter: Payer: Self-pay | Admitting: Medical-Surgical

## 2024-01-15 ENCOUNTER — Ambulatory Visit (INDEPENDENT_AMBULATORY_CARE_PROVIDER_SITE_OTHER): Payer: 59 | Admitting: Medical-Surgical

## 2024-01-15 ENCOUNTER — Encounter: Payer: Self-pay | Admitting: Medical-Surgical

## 2024-01-15 VITALS — BP 115/73 | HR 65 | Resp 20 | Ht 62.0 in | Wt 225.2 lb

## 2024-01-15 DIAGNOSIS — H7201 Central perforation of tympanic membrane, right ear: Secondary | ICD-10-CM | POA: Insufficient documentation

## 2024-01-15 DIAGNOSIS — J029 Acute pharyngitis, unspecified: Secondary | ICD-10-CM | POA: Insufficient documentation

## 2024-01-15 DIAGNOSIS — H6642 Suppurative otitis media, unspecified, left ear: Secondary | ICD-10-CM | POA: Insufficient documentation

## 2024-01-15 DIAGNOSIS — F418 Other specified anxiety disorders: Secondary | ICD-10-CM | POA: Insufficient documentation

## 2024-01-15 DIAGNOSIS — F324 Major depressive disorder, single episode, in partial remission: Secondary | ICD-10-CM

## 2024-01-15 DIAGNOSIS — Z0000 Encounter for general adult medical examination without abnormal findings: Secondary | ICD-10-CM | POA: Diagnosis not present

## 2024-01-15 DIAGNOSIS — E785 Hyperlipidemia, unspecified: Secondary | ICD-10-CM | POA: Insufficient documentation

## 2024-01-15 DIAGNOSIS — N926 Irregular menstruation, unspecified: Secondary | ICD-10-CM | POA: Diagnosis not present

## 2024-01-15 DIAGNOSIS — R7303 Prediabetes: Secondary | ICD-10-CM | POA: Insufficient documentation

## 2024-01-15 DIAGNOSIS — E781 Pure hyperglyceridemia: Secondary | ICD-10-CM | POA: Insufficient documentation

## 2024-01-15 DIAGNOSIS — E559 Vitamin D deficiency, unspecified: Secondary | ICD-10-CM | POA: Insufficient documentation

## 2024-01-15 LAB — POCT URINE PREGNANCY: Preg Test, Ur: NEGATIVE

## 2024-01-15 MED ORDER — GABAPENTIN 100 MG PO CAPS: 100.0000 mg | ORAL_CAPSULE | Freq: Three times a day (TID) | ORAL | 3 refills | Status: DC | PRN

## 2024-01-15 MED ORDER — PREDNISONE 50 MG PO TABS: 50.0000 mg | ORAL_TABLET | Freq: Every day | ORAL | 0 refills | Status: DC

## 2024-01-15 MED ORDER — DOXYCYCLINE HYCLATE 100 MG PO TABS: 100.0000 mg | ORAL_TABLET | Freq: Two times a day (BID) | ORAL | 0 refills | Status: DC

## 2024-01-15 NOTE — Progress Notes (Unsigned)
 Annual Wellness Visit     Patient: Heidi Calhoun, Female    DOB: 2001-06-11, 23 y.o.   MRN: [id number]  Subjective  Chief Complaint  Patient presents with  . Annual Exam    Heidi Calhoun is a 23 y.o. female who presents today for her Annual Wellness Visit. She reports consuming a general diet. Gym/ health club routine includes cardio and light weights. She generally feels fairly well. She reports sleeping poorly. She does have additional problems to discuss today.   Vision:Last exam Jan 2024 but last pupillary dilation 2 year ago and Dental: No current dental problems   Patient Active Problem List   Diagnosis Date Noted  . Prediabetes 01/15/2024  . Dyslipidemia 01/15/2024  . Hypertriglyceridemia 01/15/2024  . Vitamin D insufficiency 01/15/2024  . Suppurative otitis media of left ear without rupture of tympanic membrane 01/15/2024  . Central perforation of tympanic membrane, right 01/15/2024  . Sorethroat 01/15/2024  . Anxiety 07/09/2020  . Numbness and tingling in right hand 07/09/2020  . Major depressive disorder with single episode, in partial remission (HCC) 07/09/2020  . Irregular menses 10/02/2019  . Acne 10/02/2019   Past Medical History:  Diagnosis Date  . Anxiety   . Depression    Past Surgical History:  Procedure Laterality Date  . NO PAST SURGERIES     Social History   Tobacco Use  . Smoking status: Never  . Smokeless tobacco: Never  Vaping Use  . Vaping status: Former  Substance Use Topics  . Alcohol use: Never  . Drug use: Never   Social History   Socioeconomic History  . Marital status: Single    Spouse name: Not on file  . Number of children: Not on file  . Years of education: Not on file  . Highest education level: Not on file  Occupational History    Employer: [employer]  Tobacco Use  . Smoking status: Never  . Smokeless tobacco: Never  Vaping Use  . Vaping status: Former  Substance and Sexual Activity  . Alcohol use:  Never  . Drug use: Never  . Sexual activity: Yes    Birth control/protection: Injection  Other Topics Concern  . Not on file  Social History Narrative  . Not on file   Social Drivers of Health   Financial Resource Strain: Not on file  Food Insecurity: Not on file  Transportation Needs: Not on file  Physical Activity: Not on file  Stress: Not on file  Social Connections: Unknown (03/22/2022)   [facility], [facility]   Social Network   . Social Network: Not on file  Intimate Partner Violence: Unknown (02/11/2022)   Received from [facility], [facility]   HITS   . Physically Hurt: Not on file   . Insult or Talk Down To: Not on file   . Threaten Physical Harm: Not on file   . Scream or Curse: Not on file   Family Status  Relation Name Status  . Mat Uncle  (Not Specified)  . MGM  (Not Specified)  . MGF  (Not Specified)  . PGF  Deceased  No partnership data on file   Family History  Problem Relation Age of Onset  . Hypertension Maternal Uncle   . Diabetes Maternal Uncle   . Diabetes Maternal Grandmother   . Stroke Maternal Grandmother   . Hypertension Maternal Grandfather   . Kidney disease Paternal Grandfather    Allergies  Allergen Reactions  . Penicillins Hives  Pt broke out in hives.  . Amoxicillin Hives      Medications: Outpatient Medications Prior to Visit  Medication Sig  . acyclovir ointment (ZOVIRAX) 5 % Apply topically to the affected area every three hours while awake.  Marland Kitchen buPROPion (WELLBUTRIN XL) 150 MG 24 hr tablet Take 1 tablet (150 mg total) by mouth daily.  Marland Kitchen FLUoxetine HCl 60 MG TABS Take 1 tablet by mouth daily. NEEDS APPOINTMENT FOR FURTHER REFILLS.   No facility-administered medications prior to visit.    Allergies  Allergen Reactions  . Penicillins Hives    Pt broke out in hives.  . Amoxicillin Hives    Patient Care Team: Christen Butter, NP as PCP - General (Nurse Practitioner)  Review of Systems   Constitutional:  Negative for malaise/fatigue.       Sore throat for last 2 weeks  HENT:  Negative for congestion, ear pain and sore throat.   Eyes: Negative.   Respiratory: Negative.    Cardiovascular: Negative.   Gastrointestinal: Negative.   Genitourinary: Negative.        Pt reports LMP Dec 04 2023. No menses in February or March todate  Musculoskeletal: Negative.        Occasion back pain but not this visit  Neurological: Negative.   Endo/Heme/Allergies: Negative.   Psychiatric/Behavioral:  Negative for depression.        Flat affect and reports panic attack yesterday unknown stimulus. Pt requesting change to anxiety depression medications. States Wellbutrin isn't working.        Objective  BP 115/73 (BP Location: Right Arm, Cuff Size: Normal)   Pulse 65   Resp 20   Ht 5\' 2"  (1.575 m)   Wt 225 lb 3.2 oz (102.2 kg)   SpO2 99%   BMI 41.19 kg/m  BP Readings from Last 3 Encounters:  01/15/24 115/73  10/25/23 116/78  04/25/23 109/67   Wt Readings from Last 3 Encounters:  01/15/24 225 lb 3.2 oz (102.2 kg)  10/25/23 220 lb (99.8 kg)  04/25/23 210 lb (95.3 kg)      Physical Exam Constitutional:      Appearance: Normal appearance. She is obese.  HENT:     Head: Normocephalic and atraumatic.     Ears:     Comments: Right TM with small perforation upper center with fluid line and on bottom one third of TM.  Erythemic injection around periphery of TM.  Left TM bulging without defined reflection.  No fluid line.  Erythemic injection around periphery of TM.    Nose: Nose normal.     Mouth/Throat:     Mouth: Mucous membranes are moist.     Pharynx: Oropharynx is clear.     Comments: Right tonsil edematous with exudate. Eyes:     Extraocular Movements: Extraocular movements intact.     Conjunctiva/sclera: Conjunctivae normal.     Pupils: Pupils are equal, round, and reactive to light.  Cardiovascular:     Rate and Rhythm: Normal rate and regular rhythm.     Pulses:  Normal pulses.     Heart sounds: Normal heart sounds.  Pulmonary:     Effort: Pulmonary effort is normal.     Breath sounds: Normal breath sounds.  Abdominal:     Palpations: Abdomen is soft.  Musculoskeletal:        General: Normal range of motion.     Cervical back: Normal range of motion and neck supple.  Skin:    General: Skin is warm and dry.  Capillary Refill: Capillary refill takes less than 2 seconds.  Neurological:     General: No focal deficit present.     Mental Status: She is alert and oriented to person, place, and time.  Psychiatric:        Behavior: Behavior normal.        Thought Content: Thought content normal.        Judgment: Judgment normal.     Comments: Patient somewhat flattened affect with minimal response to Humor     Most recent fall risk assessment:    01/15/2024   11:29 AM  Fall Risk   Falls in the past year? 0  Number falls in past yr: 0  Injury with Fall? 0  Risk for fall due to : No Fall Risks  Follow up Falls evaluation completed    Most recent depression screenings:    01/15/2024   11:29 AM 04/25/2023   11:33 AM  PHQ 2/9 Scores  PHQ - 2 Score 5 2  PHQ- 9 Score 14 12    A score of 3 or more in women, and 4 or more in men indicates increased risk for alcohol abuse, EXCEPT if all of the points are from question 1   Vision/Hearing Screen: No results found.  Last CBC Lab Results  Component Value Date   WBC 7.4 04/25/2023   HGB 12.3 04/25/2023   HCT 37.3 04/25/2023   MCV 89.2 04/25/2023   MCH 29.4 04/25/2023   RDW 13.0 04/25/2023   PLT 281 04/25/2023   Last metabolic panel Lab Results  Component Value Date   GLUCOSE 90 04/25/2023   NA 139 04/25/2023   K 4.3 04/25/2023   CL 105 04/25/2023   CO2 25 04/25/2023   BUN 11 04/25/2023   CREATININE 0.57 04/25/2023   EGFR 130 04/25/2023   CALCIUM 9.4 04/25/2023   PROT 7.2 04/25/2023   BILITOT 0.5 04/25/2023   AST 13 04/25/2023   ALT 15 04/25/2023   ANIONGAP 9 02/27/2022    Last lipids Lab Results  Component Value Date   CHOL 179 04/25/2023   HDL 44 (L) 04/25/2023   LDLCALC 98 04/25/2023   TRIG 246 (H) 04/25/2023   CHOLHDL 4.1 04/25/2023   Last hemoglobin A1c Lab Results  Component Value Date   HGBA1C 5.8 (H) 04/25/2023   Last thyroid functions Lab Results  Component Value Date   TSH 0.98 04/25/2023   Last vitamin D Lab Results  Component Value Date   VD25OH 29 (L) 04/25/2023   Last vitamin B12 and Folate No results found for: "VITAMINB12", "FOLATE"    No results found for any visits on 01/15/24.    Assessment & Plan   Annual wellness visit done today including the all of the following: Reviewed patient's Family Medical History Reviewed and updated list of patient's medical providers Assessment of cognitive impairment was done Assessed patient's functional ability Established a written schedule for health screening services Health Risk Assessent Completed and Reviewed  Exercise Activities and Dietary recommendations  Goals   None     Immunization History  Administered Date(s) Administered  . Hepatitis B, ADULT 12/04/2021  . Influenza,inj,Quad PF,6+ Mos 08/09/2021  . Influenza-Unspecified 10/02/2019, 06/21/2022  . PFIZER(Purple Top)SARS-COV-2 Vaccination 01/24/2020, 02/14/2020, 09/29/2020  . Tdap 07/09/2020    Health Maintenance  Topic Date Due  . HPV VACCINES (1 - 3-dose series) Never done  . COVID-19 Vaccine (4 - 2024-25 season) 01/31/2024 (Originally 07/09/2023)  . INFLUENZA VACCINE  02/05/2024 (Originally 06/08/2023)  .  Cervical Cancer Screening (Pap smear)  06/14/2024  . DTaP/Tdap/Td (2 - Td or Tdap) 07/09/2030  . Hepatitis C Screening  Completed  . HIV Screening  Completed     Discussed health benefits of physical activity, and encouraged her to engage in regular exercise appropriate for her age and condition.    Problem List Items Addressed This Visit    1. Annual physical exam (Primary) Patient describes  her health is generally good despite having sore throat and sore ears for last 2 weeks. Has started new workout program and is attending gym 5 days a week. - CBC with Differential/Platelet - CMP14+EGFR - Lipid panel  2. Major depressive disorder with single episode, in partial remission (HCC) 3. Anxiety Patient taking bupropion 150 mg p.o. daily and fluoxetine 60 mg p.o. daily as directed without complications or side effects.  Patient states that they do not seem to be working very well and she fluctuates between anxiety and feeling depressed and reports a panic attack yesterday.  Patient identifies anxiety as being worse than depression.  Requesting changes to related medications and is amenable to starting gabapentin will return in 2 weeks for follow up. - gabapentin (NEURONTIN) 100 MG capsule; Take 1 capsule (100 mg total) by mouth 3 (three) times daily as needed.  Dispense: 90 capsule; Refill: 3  4. Prediabetes And was prescribed patient reports that her mother is prediabetic and her grandmother is diabetic.  Last POC A1c 5.8 consistent with prediabetes.  Draw serum hemoglobin A1c. - Hemoglobin A1c  5. Hypertriglyceridemia Patient has history of elevated triglycerides of 246 on 04/25/2023 with HDL of 44 and LDL of 98.  Draw lipid panel. - Lipid panel  6. Vitamin D insufficiency Patient has vitamin D deficiency with last vitamin D level of 29 on 04/25/2023.  Check vitamin D level. - VITAMIN D 25 Hydroxy (Vit-D Deficiency, Fractures)  7. Sorethroat Presents with 2-week history of sore throat and full ears.  Patient seen by urgent care 01/02/2024 for sore throat bacterial pharyngitis and was given course of Zithromax 500 mg daily x 5 days.  Patient states that it did not help much.  Patient taking over-the-counter Cepacol lozenge lozenges 3 mg as needed for throat pain.  Again without noticeable effect.  Patient reports pain in throat today 5 out of 10.  On exam patient's right tonsil swollen  with pustules negative erythema noted.  Left tonsil only mildly enlarged.  Presentation coupled with right TM rupture and fluid line with bulging right TM is consistent with bacterial pharyngitis and otitis media  8. Central perforation of tympanic membrane, right 9. Suppurative otitis media of left ear without rupture of tympanic membrane Patient complains of 2-week history of sore throat and full ears.  Reports ears popping 1 week ago.  Denies pain at this time. Right TM with small perforation to upper middle membrane.  Erythemic injection on periphery of TM.  Visible fluid line approximately one third from bottom.left TM bulging with diffuse light reflection.  Positive erythemic injection around periphery of left TM. - doxycycline (VIBRA-TABS) 100 MG tablet; Take 1 tablet (100 mg total) by mouth 2 (two) times daily.  Dispense: 14 tablet; Refill: 0  10. Irregular menses LMP 27 January.  POC pregnancy test negative today. - POCT urine pregnancy   Return in about 2 weeks (around 01/29/2024) for ear recheck.     Loma Sousa AGNP Student

## 2024-01-16 ENCOUNTER — Encounter: Payer: Self-pay | Admitting: Medical-Surgical

## 2024-01-16 DIAGNOSIS — R7303 Prediabetes: Secondary | ICD-10-CM

## 2024-01-16 LAB — CMP14+EGFR
ALT: 19 IU/L (ref 0–32)
AST: 18 IU/L (ref 0–40)
Albumin: 4.7 g/dL (ref 4.0–5.0)
Alkaline Phosphatase: 89 IU/L (ref 44–121)
BUN/Creatinine Ratio: 19 (ref 9–23)
BUN: 11 mg/dL (ref 6–20)
Bilirubin Total: 0.2 mg/dL (ref 0.0–1.2)
CO2: 20 mmol/L (ref 20–29)
Calcium: 10 mg/dL (ref 8.7–10.2)
Chloride: 101 mmol/L (ref 96–106)
Creatinine, Ser: 0.57 mg/dL (ref 0.57–1.00)
Globulin, Total: 3.1 g/dL (ref 1.5–4.5)
Glucose: 81 mg/dL (ref 70–99)
Potassium: 4.7 mmol/L (ref 3.5–5.2)
Sodium: 138 mmol/L (ref 134–144)
Total Protein: 7.8 g/dL (ref 6.0–8.5)
eGFR: 129 mL/min/{1.73_m2} (ref >59)

## 2024-01-16 LAB — HEMOGLOBIN A1C
Est. average glucose Bld gHb Est-mCnc: 128 mg/dL
Hgb A1c MFr Bld: 6.1 % — ABNORMAL HIGH (ref 4.8–5.6)

## 2024-01-16 LAB — CBC WITH DIFFERENTIAL/PLATELET
Basophils Absolute: 0.1 10*3/uL (ref 0.0–0.2)
Basos: 1 %
EOS (ABSOLUTE): 0.1 10*3/uL (ref 0.0–0.4)
Eos: 1 %
Hematocrit: 39.6 % (ref 34.0–46.6)
Hemoglobin: 13.2 g/dL (ref 11.1–15.9)
Immature Grans (Abs): 0 10*3/uL (ref 0.0–0.1)
Immature Granulocytes: 0 %
Lymphocytes Absolute: 2.7 10*3/uL (ref 0.7–3.1)
Lymphs: 32 %
MCH: 29.1 pg (ref 26.6–33.0)
MCHC: 33.3 g/dL (ref 31.5–35.7)
MCV: 87 fL (ref 79–97)
Monocytes Absolute: 0.5 10*3/uL (ref 0.1–0.9)
Monocytes: 6 %
Neutrophils Absolute: 5 10*3/uL (ref 1.4–7.0)
Neutrophils: 60 %
Platelets: 338 10*3/uL (ref 150–450)
RBC: 4.54 x10E6/uL (ref 3.77–5.28)
RDW: 13.1 % (ref 11.7–15.4)
WBC: 8.4 10*3/uL (ref 3.4–10.8)

## 2024-01-16 LAB — LIPID PANEL
Chol/HDL Ratio: 5.4 ratio — ABNORMAL HIGH (ref 0.0–4.4)
Cholesterol, Total: 194 mg/dL (ref 100–199)
HDL: 36 mg/dL — ABNORMAL LOW (ref >39)
LDL Chol Calc (NIH): 109 mg/dL — ABNORMAL HIGH (ref 0–99)
Triglycerides: 281 mg/dL — ABNORMAL HIGH (ref 0–149)
VLDL Cholesterol Cal: 49 mg/dL — ABNORMAL HIGH (ref 5–40)

## 2024-01-16 LAB — VITAMIN D 25 HYDROXY (VIT D DEFICIENCY, FRACTURES): Vit D, 25-Hydroxy: 16.2 ng/mL — ABNORMAL LOW (ref 30.0–100.0)

## 2024-01-16 NOTE — Progress Notes (Signed)
 Medical screening examination/treatment was performed by qualified clinical staff member and as supervising provider I was immediately available for consultation/collaboration. I have reviewed documentation and agree with assessment and plan.  Thayer Ohm, DNP, APRN, FNP-BC [facility]

## 2024-01-17 MED ORDER — METFORMIN HCL ER 500 MG PO TB24: 500.0000 mg | ORAL_TABLET | Freq: Every day | ORAL | 1 refills | Status: DC

## 2024-01-19 ENCOUNTER — Ambulatory Visit: Payer: 59 | Admitting: Medical-Surgical

## 2024-01-30 ENCOUNTER — Ambulatory Visit: Admitting: Medical-Surgical

## 2024-01-30 ENCOUNTER — Encounter: Payer: Self-pay | Admitting: Medical-Surgical

## 2024-01-30 VITALS — BP 100/66 | HR 69 | Resp 20 | Ht 62.0 in | Wt 216.7 lb

## 2024-01-30 DIAGNOSIS — H60393 Other infective otitis externa, bilateral: Secondary | ICD-10-CM | POA: Diagnosis not present

## 2024-01-30 MED ORDER — CIPROFLOXACIN-DEXAMETHASONE 0.3-0.1 % OT SUSP: 4.0000 [drp] | Freq: Two times a day (BID) | OTIC | 0 refills | Status: AC

## 2024-01-30 MED ORDER — BUPROPION HCL ER (XL) 150 MG PO TB24: 150.0000 mg | ORAL_TABLET | Freq: Every day | ORAL | 1 refills | Status: DC

## 2024-01-30 NOTE — Progress Notes (Signed)
        Established patient visit  History, exam, impression, and plan:  1. Other infective acute otitis externa of both ears (Primary) Very pleasant 23 year old female presenting today for a 2-week follow-up on her recent ear symptoms.  At her annual physical proximately 2 weeks ago, she was found to have what was thought to be a perforation in the right eardrum as well as bilateral otitis media.  She was treated with oral antibiotics and most of her symptoms have fully resolved.  Today she reports that she continues to have itching in the bilateral ear canals as well as some discomfort, mainly in the left side just below the ear down into the neck.  On exam, unable to visualize the suspected perforation in the right TM although there is a small bit of cerumen that has pulled away from the eardrum which could have appeared as a small perforation at the time.  Bilateral TMs looking much better.  Unfortunately, bilateral ear canals are edematous, tender with otoscopic exam, small amount of cerumen bilaterally.  Ear canals appear to be moist with no identifiable exudate.  Symptoms are consistent with otitis externa.  Reviewed treatment recommendations with caution for perforation.  Starting Ciprodex 4 drops to each ear twice daily for 7 days.  Make sure to avoid excess moisture in the ear and monitor for any discharge/drainage.  Procedures performed this visit: None.  Return if symptoms worsen or fail to improve.  __________________________________ Thayer Ohm, DNP, APRN, FNP-BC Primary [facility] and Sports Medicine [facility]

## 2024-02-04 ENCOUNTER — Other Ambulatory Visit: Payer: Self-pay | Admitting: Medical-Surgical

## 2024-02-09 ENCOUNTER — Other Ambulatory Visit: Payer: Self-pay | Admitting: Medical-Surgical

## 2024-02-09 DIAGNOSIS — Z832 Family history of diseases of the blood and blood-forming organs and certain disorders involving the immune mechanism: Secondary | ICD-10-CM

## 2024-02-14 ENCOUNTER — Encounter: Payer: Self-pay | Admitting: Medical-Surgical

## 2024-02-14 MED ORDER — VALACYCLOVIR HCL 500 MG PO TABS: 500.0000 mg | ORAL_TABLET | Freq: Every day | ORAL | 3 refills | Status: AC

## 2024-03-03 NOTE — Progress Notes (Unsigned)
        Established patient visit  History, exam, impression, and plan:  No problem-specific Assessment & Plan notes found for this encounter.   ROS  Physical Exam  Procedures performed this visit: None.  No follow-ups on file.  __________________________________ Maryl Snook, DNP, APRN, FNP-BC Primary Care and Sports Medicine [facility] [place]

## 2024-03-04 ENCOUNTER — Ambulatory Visit: Admitting: Medical-Surgical

## 2024-03-04 ENCOUNTER — Encounter: Payer: Self-pay | Admitting: Medical-Surgical

## 2024-03-04 VITALS — BP 111/73 | HR 62 | Resp 20 | Ht 62.0 in | Wt 215.5 lb

## 2024-03-04 DIAGNOSIS — H9203 Otalgia, bilateral: Secondary | ICD-10-CM

## 2024-03-04 DIAGNOSIS — F419 Anxiety disorder, unspecified: Secondary | ICD-10-CM | POA: Diagnosis not present

## 2024-03-04 DIAGNOSIS — R7303 Prediabetes: Secondary | ICD-10-CM

## 2024-03-04 DIAGNOSIS — F324 Major depressive disorder, single episode, in partial remission: Secondary | ICD-10-CM

## 2024-03-04 MED ORDER — METFORMIN HCL ER 500 MG PO TB24: 500.0000 mg | ORAL_TABLET | Freq: Every day | ORAL | 1 refills | Status: DC

## 2024-03-22 ENCOUNTER — Encounter: Payer: Self-pay | Admitting: Medical-Surgical

## 2024-03-22 NOTE — Telephone Encounter (Signed)
 See Heidi Calhoun's chart. DOB 07/04/2012

## 2024-06-16 ENCOUNTER — Other Ambulatory Visit: Payer: Self-pay | Admitting: Medical-Surgical

## 2024-06-16 ENCOUNTER — Encounter: Payer: Self-pay | Admitting: Medical-Surgical

## 2024-06-17 MED ORDER — FLUOXETINE HCL 60 MG PO TABS: 1.0000 | ORAL_TABLET | Freq: Every day | ORAL | 0 refills | Status: DC

## 2024-07-16 ENCOUNTER — Ambulatory Visit: Admitting: Medical-Surgical

## 2024-07-16 ENCOUNTER — Encounter: Payer: Self-pay | Admitting: Medical-Surgical

## 2024-07-16 VITALS — BP 105/68 | HR 62 | Resp 20 | Ht 62.0 in | Wt 209.0 lb

## 2024-07-16 DIAGNOSIS — Z23 Encounter for immunization: Secondary | ICD-10-CM

## 2024-07-16 DIAGNOSIS — F419 Anxiety disorder, unspecified: Secondary | ICD-10-CM | POA: Diagnosis not present

## 2024-07-16 DIAGNOSIS — R7303 Prediabetes: Secondary | ICD-10-CM | POA: Diagnosis not present

## 2024-07-16 DIAGNOSIS — F324 Major depressive disorder, single episode, in partial remission: Secondary | ICD-10-CM

## 2024-07-16 LAB — POCT GLYCOSYLATED HEMOGLOBIN (HGB A1C): Hemoglobin A1C: 5.5 % (ref 4.0–5.6)

## 2024-07-16 MED ORDER — METFORMIN HCL ER 500 MG PO TB24: 500.0000 mg | ORAL_TABLET | Freq: Every day | ORAL | 3 refills | Status: AC

## 2024-07-16 MED ORDER — FLUOXETINE HCL 60 MG PO TABS: 1.0000 | ORAL_TABLET | Freq: Every day | ORAL | 3 refills | Status: AC

## 2024-07-16 MED ORDER — BUPROPION HCL ER (XL) 300 MG PO TB24
300.0000 mg | ORAL_TABLET | Freq: Every day | ORAL | 3 refills | Status: AC
Start: 2024-07-16 — End: ?

## 2024-07-16 NOTE — Progress Notes (Signed)
        Established patient visit   History of Present Illness   Discussed the use of AI scribe software for clinical note transcription with the patient, who gave verbal consent to proceed.  History of Present Illness   Heidi Calhoun is a 23 year old female with anxiety and depression who presents for medication management.  Mood and anxiety symptoms - Ongoing anxiety and depression despite daily use of Wellbutrin  150mg  and fluoxetine  60mg  - PHQ-9 score: 17 - Anxiety score: 11 - No current thoughts of self-harm or harm to others  Glycemic control - A1c previously 6.1% - Improvement attributed to dietary changes and participation in Taekwondo - Not eating as much bread now  Herpetic infections - No recent cold sores - Current medication includes Valtrex  500mg  daily for suppression     Physical Exam   Physical Exam Vitals reviewed.  Constitutional:      General: She is not in acute distress.    Appearance: Normal appearance. She is obese. She is not ill-appearing.  HENT:     Head: Normocephalic and atraumatic.  Cardiovascular:     Rate and Rhythm: Normal rate and regular rhythm.     Pulses: Normal pulses.     Heart sounds: Normal heart sounds. No murmur heard.    No friction rub. No gallop.  Pulmonary:     Effort: Pulmonary effort is normal. No respiratory distress.     Breath sounds: Normal breath sounds. No wheezing.  Skin:    General: Skin is warm and dry.  Neurological:     Mental Status: She is alert and oriented to person, place, and time.  Psychiatric:        Mood and Affect: Mood normal.        Behavior: Behavior normal.        Thought Content: Thought content normal.        Judgment: Judgment normal.    Assessment & Plan   Assessment and Plan    Depression and Anxiety Disorder Persistent depression and anxiety with PHQ-9 score of 17 and anxiety score of 11. No improvement with current medication regimen. No suicidal ideation. - Increase  Wellbutrin  to 300 mg daily. - Continue fluoxetine  60mg  daily. - Follow up in 4-6 weeks to assess response.  Prediabetes Prior A1c of 6.1% indicating prediabetes. Metformin  well tolerated. Changes in diet made and now exercising regularly. - Recheck of A1c at 5.5%. - Encouraged continuation of diet/exercise modifications.   General Health Maintenance Due for Pap smear. Discussed and administered HPV vaccine today. - Administer first dose of HPV vaccine today and observe for 15 minutes post-vaccination. - Offer Pap smear at next visit if desired.     Follow up   Return in about 6 weeks (around 08/27/2024) for mood follow up or sooner if needed. __________________________________ Zada FREDRIK Palin, DNP, APRN, FNP-BC Primary Care and Sports Medicine [facility]

## 2024-07-26 ENCOUNTER — Ambulatory Visit: Admitting: Medical-Surgical

## 2024-08-27 ENCOUNTER — Ambulatory Visit: Admitting: Medical-Surgical

## 2024-09-11 ENCOUNTER — Other Ambulatory Visit: Payer: Self-pay | Admitting: Medical-Surgical
